# Patient Record
Sex: Male | Born: 1955 | Race: White | Hispanic: No | Marital: Married | Smoking: Never smoker
Health system: Southern US, Community
[De-identification: ages and names within clinical notes are randomized; demographics above are authoritative.]

## PROBLEM LIST (undated history)

## (undated) DIAGNOSIS — Z9889 Other specified postprocedural states: Secondary | ICD-10-CM

## (undated) DIAGNOSIS — Z96659 Presence of unspecified artificial knee joint: Secondary | ICD-10-CM

## (undated) DIAGNOSIS — T8859XA Other complications of anesthesia, initial encounter: Secondary | ICD-10-CM

## (undated) DIAGNOSIS — T4145XA Adverse effect of unspecified anesthetic, initial encounter: Secondary | ICD-10-CM

## (undated) DIAGNOSIS — E7800 Pure hypercholesterolemia, unspecified: Secondary | ICD-10-CM

## (undated) DIAGNOSIS — R112 Nausea with vomiting, unspecified: Secondary | ICD-10-CM

## (undated) DIAGNOSIS — M1990 Unspecified osteoarthritis, unspecified site: Secondary | ICD-10-CM

## (undated) HISTORY — PX: BACK SURGERY: SHX140

## (undated) HISTORY — PX: CARPAL TUNNEL RELEASE: SHX101

## (undated) HISTORY — PX: OTHER SURGICAL HISTORY: SHX169

---

## 2003-07-14 ENCOUNTER — Ambulatory Visit (HOSPITAL_BASED_OUTPATIENT_CLINIC_OR_DEPARTMENT_OTHER): Admission: RE | Admit: 2003-07-14 | Discharge: 2003-07-14 | Payer: Self-pay | Admitting: Orthopedic Surgery

## 2006-10-18 ENCOUNTER — Ambulatory Visit: Payer: Self-pay | Admitting: Orthopedic Surgery

## 2007-06-20 ENCOUNTER — Ambulatory Visit: Payer: Self-pay | Admitting: Orthopedic Surgery

## 2008-02-04 ENCOUNTER — Encounter: Payer: Self-pay | Admitting: Orthopedic Surgery

## 2008-03-03 ENCOUNTER — Ambulatory Visit: Payer: Self-pay | Admitting: Orthopedic Surgery

## 2008-03-03 DIAGNOSIS — M25579 Pain in unspecified ankle and joints of unspecified foot: Secondary | ICD-10-CM

## 2011-04-07 NOTE — Op Note (Signed)
NAME:  Corey Clarke, Corey Clarke NO.:  [id number]   MEDICAL RECORD NO.:  [id number]                   PATIENT TYPE:  AMB   LOCATION:  DSC                                  FACILITY:  [facility]   PHYSICIAN:  Cindee Salt, M.D.                    DATE OF BIRTH:  21-Sep-1956   DATE OF PROCEDURE:  07/14/2003  DATE OF DISCHARGE:                                 OPERATIVE REPORT   PREOPERATIVE DIAGNOSIS:  Carpal tunnel syndrome, left hand.   POSTOPERATIVE DIAGNOSIS:  Carpal tunnel syndrome, left hand.   OPERATION PERFORMED:  Decompression, left median nerve.   SURGEON:  Cindee Salt, M.D.   ASSISTANTCarolyne Fiscal.   ANESTHESIA:  General.   INDICATIONS FOR PROCEDURE:  The patient is a 55 year old male with a history  of carpal tunnel syndrome, EMG nerve conduction positive, which has not  responded to conservative treatment.   DESCRIPTION OF PROCEDURE:  The patient was brought to the operating room  with the intent of doing an IV regional anesthetic; however, he had some  changes to the circulation of his hand and Anesthesia felt better with a  general anesthetic rather than injection.  A LMA was placed.  General  anesthetic given.  He was prepped using DuraPrep in supine position.  Left  arm free.  The limb was exsanguinated with an Esmarch bandage.  Tourniquet  placed on the forearm, was inflated to .  A longitudinal incision was  made in the palm and carried down through subcutaneous tissue, bleeders were  electrocauterized.  The palmar fascia was split superficial palmar arch  identified.  The flexor tendon to the ring and little finger identified to  the ulnar side of the medial nerve.  The carpal retinaculum was incised with  sharp dissection.  A right angle and Sewell retractor were placed between  the skin and forearm fascia.  The fascia was released for approximately 3 cm  proximal to the wrist crease under direct vision.  It now was explored.  Tenosynovial  tissue was thickened.  The motor branch was noted to be tented  over retinaculum.  This portion of the retinaculum was incised.  No further  lesions were identified.  The wound was irrigated.  Skin was closed with  interrupted 5-0 nylon sutures.  Sterile compressive dressing and splint was  applied.  The patient tolerated the procedure well and was taken to the  recovery room for observation in satisfactory condition.  He is discharged  to home to return to the [facility] in one week on Vicodin  and Keflex.                                                Cindee Salt,  M.D.    Angelique Blonder  D:  07/14/2003  T:  07/14/2003  Job:  [id number]

## 2016-10-24 ENCOUNTER — Ambulatory Visit: Payer: Self-pay | Admitting: Orthopedic Surgery

## 2016-11-30 ENCOUNTER — Encounter (HOSPITAL_COMMUNITY): Admission: RE | Admit: 2016-11-30 | Payer: Self-pay | Source: Ambulatory Visit

## 2016-12-07 ENCOUNTER — Encounter (HOSPITAL_COMMUNITY): Payer: Self-pay

## 2016-12-07 ENCOUNTER — Inpatient Hospital Stay (HOSPITAL_COMMUNITY): Admit: 2016-12-07 | Payer: Self-pay | Admitting: Specialist

## 2016-12-07 SURGERY — ARTHROPLASTY, KNEE, TOTAL
Anesthesia: General | Laterality: Right

## 2017-04-30 ENCOUNTER — Ambulatory Visit: Payer: Self-pay | Admitting: Specialist

## 2017-06-06 NOTE — Patient Instructions (Signed)
Corey Clarke  06/06/2017   Your procedure is scheduled on: 06/21/17  Report to [facility] Main  Entrance Take [place]  elevators to 3rd floor to  [address] at     [phone].    Call this number if you have problems the morning of surgery [phone]     Remember: ONLY 1 PERSON MAY GO WITH YOU TO SHORT STAY TO GET  READY MORNING OF YOUR SURGERY.  Do not eat food or drink liquids :After Midnight.     Take these medicines the morning of surgery with A SIP OF WATER:  DO NOT TAKE ANY DIABETIC MEDICATIONS DAY OF YOUR SURGERY                               You may not have any metal on your body including hair pins and              piercings  Do not wear jewelry, make-up, lotions, powders or perfumes, deodorant             Do not wear nail polish.  Do not shave  48 hours prior to surgery.              Men may shave face and neck.   Do not bring valuables to the hospital. [place].  Contacts, dentures or bridgework may not be worn into surgery.  Leave suitcase in the car. After surgery it may be brought to your room.     Patients discharged the day of surgery will not be allowed to drive home.  Name and phone number of your driver:  Special Instructions: N/A              Please read over the following fact sheets you were given: _____________________________________________________________________             [facility] - Preparing for Surgery Before surgery, you can play an important role.  Because skin is not sterile, your skin needs to be as free of germs as possible.  You can reduce the number of germs on your skin by washing with CHG (chlorahexidine gluconate) soap before surgery.  CHG is an antiseptic cleaner which kills germs and bonds with the skin to continue killing germs even after washing. Please DO NOT use if you have an allergy to CHG or antibacterial soaps.  If your skin becomes  reddened/irritated stop using the CHG and inform your nurse when you arrive at Short Stay. Do not shave (including legs and underarms) for at least 48 hours prior to the first CHG shower.  You may shave your face/neck. Please follow these instructions carefully:  1.  Shower with CHG Soap the night before surgery and the  morning of Surgery.  2.  If you choose to wash your hair, wash your hair first as usual with your  normal  shampoo.  3.  After you shampoo, rinse your hair and body thoroughly to remove the  shampoo.                           4.  Use CHG as you would any other liquid soap.  You can apply chg directly  to the skin and wash                       Gently with a scrungie or clean washcloth.  5.  Apply the CHG Soap to your body ONLY FROM THE NECK DOWN.   Do not use on face/ open                           Wound or open sores. Avoid contact with eyes, ears mouth and genitals (private parts).                       Wash face,  Genitals (private parts) with your normal soap.             6.  Wash thoroughly, paying special attention to the area where your surgery  will be performed.  7.  Thoroughly rinse your body with warm water from the neck down.  8.  DO NOT shower/wash with your normal soap after using and rinsing off  the CHG Soap.                9.  Pat yourself dry with a clean towel.            10.  Wear clean pajamas.            11.  Place clean sheets on your bed the night of your first shower and do not  sleep with pets. Day of Surgery : Do not apply any lotions/deodorants the morning of surgery.  Please wear clean clothes to the hospital/surgery center.  FAILURE TO FOLLOW THESE INSTRUCTIONS MAY RESULT IN THE CANCELLATION OF YOUR SURGERY PATIENT SIGNATURE_________________________________  NURSE SIGNATURE__________________________________  ________________________________________________________________________  WHAT IS A BLOOD TRANSFUSION? Blood Transfusion Information  A  transfusion is the replacement of blood or some of its parts. Blood is made up of multiple cells which provide different functions.  Red blood cells carry oxygen and are used for blood loss replacement.  White blood cells fight against infection.  Platelets control bleeding.  Plasma helps clot blood.  Other blood products are available for specialized needs, such as hemophilia or other clotting disorders. BEFORE THE TRANSFUSION  Who gives blood for transfusions?   Healthy volunteers who are fully evaluated to make sure their blood is safe. This is blood bank blood. Transfusion therapy is the safest it has ever been in the practice of medicine. Before blood is taken from a donor, a complete history is taken to make sure that person has no history of diseases nor engages in risky social behavior (examples are intravenous drug use or sexual activity with multiple partners). The donor's travel history is screened to minimize risk of transmitting infections, such as malaria. The donated blood is tested for signs of infectious diseases, such as HIV and hepatitis. The blood is then tested to be sure it is compatible with you in order to minimize the chance of a transfusion reaction. If you or a relative donates blood, this is often done in anticipation of surgery and is not appropriate for emergency situations. It takes many days to process the donated blood. RISKS AND COMPLICATIONS Although transfusion therapy is very safe and saves many lives, the main dangers of transfusion include:   Getting an infectious disease.  Developing a transfusion reaction. This is an allergic reaction to something in the blood you were given. Every precaution is taken to prevent  this. The decision to have a blood transfusion has been considered carefully by your caregiver before blood is given. Blood is not given unless the benefits outweigh the risks. AFTER THE TRANSFUSION  Right after receiving a blood transfusion,  you will usually feel much better and more energetic. This is especially true if your red blood cells have gotten low (anemic). The transfusion raises the level of the red blood cells which carry oxygen, and this usually causes an energy increase.  The nurse administering the transfusion will monitor you carefully for complications. HOME CARE INSTRUCTIONS  No special instructions are needed after a transfusion. You may find your energy is better. Speak with your caregiver about any limitations on activity for underlying diseases you may have. SEEK MEDICAL CARE IF:   Your condition is not improving after your transfusion.  You develop redness or irritation at the intravenous (IV) site. SEEK IMMEDIATE MEDICAL CARE IF:  Any of the following symptoms occur over the next 12 hours:  Shaking chills.  You have a temperature by mouth above 102 F (38.9 C), not controlled by medicine.  Chest, back, or muscle pain.  People around you feel you are not acting correctly or are confused.  Shortness of breath or difficulty breathing.  Dizziness and fainting.  You get a rash or develop hives.  You have a decrease in urine output.  Your urine turns a dark color or changes to pink, red, or brown. Any of the following symptoms occur over the next 10 days:  You have a temperature by mouth above 102 F (38.9 C), not controlled by medicine.  Shortness of breath.  Weakness after normal activity.  The white part of the eye turns yellow (jaundice).  You have a decrease in the amount of urine or are urinating less often.  Your urine turns a dark color or changes to pink, red, or brown. Document Released: 11/03/2000 Document Revised: 01/29/2012 Document Reviewed: 06/22/2008 ExitCare Patient Information 2014 [place].  _______________________________________________________________________  Incentive Spirometer  An incentive spirometer is a tool that can help keep your lungs clear and  active. This tool measures how well you are filling your lungs with each breath. Taking long deep breaths may help reverse or decrease the chance of developing breathing (pulmonary) problems (especially infection) following:  A long period of time when you are unable to move or be active. BEFORE THE PROCEDURE   If the spirometer includes an indicator to show your best effort, your nurse or respiratory therapist will set it to a desired goal.  If possible, sit up straight or lean slightly forward. Try not to slouch.  Hold the incentive spirometer in an upright position. INSTRUCTIONS FOR USE  1. Sit on the edge of your bed if possible, or sit up as far as you can in bed or on a chair. 2. Hold the incentive spirometer in an upright position. 3. Breathe out normally. 4. Place the mouthpiece in your mouth and seal your lips tightly around it. 5. Breathe in slowly and as deeply as possible, raising the piston or the ball toward the top of the column. 6. Hold your breath for 3-5 seconds or for as long as possible. Allow the piston or ball to fall to the bottom of the column. 7. Remove the mouthpiece from your mouth and breathe out normally. 8. Rest for a few seconds and repeat Steps 1 through 7 at least 10 times every 1-2 hours when you are awake. Take your time and take  a few normal breaths between deep breaths. 9. The spirometer may include an indicator to show your best effort. Use the indicator as a goal to work toward during each repetition. 10. After each set of 10 deep breaths, practice coughing to be sure your lungs are clear. If you have an incision (the cut made at the time of surgery), support your incision when coughing by placing a pillow or rolled up towels firmly against it. Once you are able to get out of bed, walk around indoors and cough well. You may stop using the incentive spirometer when instructed by your caregiver.  RISKS AND COMPLICATIONS  Take your time so you do not get  dizzy or light-headed.  If you are in pain, you may need to take or ask for pain medication before doing incentive spirometry. It is harder to take a deep breath if you are having pain. AFTER USE  Rest and breathe slowly and easily.  It can be helpful to keep track of a log of your progress. Your caregiver can provide you with a simple table to help with this. If you are using the spirometer at home, follow these instructions: [place] IF:   You are having difficultly using the spirometer.  You have trouble using the spirometer as often as instructed.  Your pain medication is not giving enough relief while using the spirometer.  You develop fever of 100.5 F (38.1 C) or higher. SEEK IMMEDIATE MEDICAL CARE IF:   You cough up bloody sputum that had not been present before.  You develop fever of 102 F (38.9 C) or greater.  You develop worsening pain at or near the incision site. MAKE SURE YOU:   Understand these instructions.  Will watch your condition.  Will get help right away if you are not doing well or get worse. Document Released: 03/19/2007 Document Revised: 01/29/2012 Document Reviewed: 05/20/2007 [facility] Patient Information 2014 [place], [place].   ________________________________________________________________________

## 2017-06-07 ENCOUNTER — Encounter (HOSPITAL_COMMUNITY): Payer: Self-pay

## 2017-06-07 ENCOUNTER — Encounter (HOSPITAL_COMMUNITY)
Admission: RE | Admit: 2017-06-07 | Discharge: 2017-06-07 | Disposition: A | Discharge status: Home / Self Care | Payer: 59 | Source: Ambulatory Visit | Attending: Specialist | Admitting: Specialist

## 2017-06-07 DIAGNOSIS — Z01818 Encounter for other preprocedural examination: Secondary | ICD-10-CM | POA: Insufficient documentation

## 2017-06-07 HISTORY — DX: Nausea with vomiting, unspecified: R11.2

## 2017-06-07 HISTORY — DX: Unspecified osteoarthritis, unspecified site: M19.90

## 2017-06-07 HISTORY — DX: Adverse effect of unspecified anesthetic, initial encounter: T41.45XA

## 2017-06-07 HISTORY — DX: Other specified postprocedural states: Z98.890

## 2017-06-07 HISTORY — DX: Other complications of anesthesia, initial encounter: T88.59XA

## 2017-06-07 LAB — BASIC METABOLIC PANEL
Anion gap: 9 (ref 5–15)
BUN: 17 mg/dL (ref 6–20)
CHLORIDE: 103 mmol/L (ref 101–111)
CO2: 27 mmol/L (ref 22–32)
CREATININE: 0.78 mg/dL (ref 0.61–1.24)
Calcium: 9.1 mg/dL (ref 8.9–10.3)
GFR calc Af Amer: >60 mL/min (ref >60)
GFR calc non Af Amer: >60 mL/min (ref >60)
Glucose, Bld: 99 mg/dL (ref 65–99)
Potassium: 4.5 mmol/L (ref 3.5–5.1)
Sodium: 139 mmol/L (ref 135–145)

## 2017-06-07 LAB — CBC
HCT: 44.2 % (ref 39.0–52.0)
Hemoglobin: 15.4 g/dL (ref 13.0–17.0)
MCH: 31.8 pg (ref 26.0–34.0)
MCHC: 34.8 g/dL (ref 30.0–36.0)
MCV: 91.1 fL (ref 78.0–100.0)
PLATELETS: 246 10*3/uL (ref 150–400)
RBC: 4.85 MIL/uL (ref 4.22–5.81)
RDW: 13.1 % (ref 11.5–15.5)
WBC: 9.1 10*3/uL (ref 4.0–10.5)

## 2017-06-07 LAB — URINALYSIS, ROUTINE W REFLEX MICROSCOPIC
BACTERIA UA: NONE SEEN
Bilirubin Urine: NEGATIVE
Glucose, UA: NEGATIVE mg/dL
HGB URINE DIPSTICK: NEGATIVE
Ketones, ur: NEGATIVE mg/dL
Nitrite: NEGATIVE
Protein, ur: NEGATIVE mg/dL
SPECIFIC GRAVITY, URINE: 1.016 (ref 1.005–1.030)
SQUAMOUS EPITHELIAL / LPF: NONE SEEN
pH: 6 (ref 5.0–8.0)

## 2017-06-07 LAB — SURGICAL PCR SCREEN
MRSA, PCR: NEGATIVE
STAPHYLOCOCCUS AUREUS: NEGATIVE

## 2017-06-07 LAB — APTT: APTT: 26 s (ref 24–36)

## 2017-06-07 LAB — PROTIME-INR
INR: 0.96
Prothrombin Time: 12.8 seconds (ref 11.4–15.2)

## 2017-06-11 ENCOUNTER — Ambulatory Visit: Payer: Self-pay | Admitting: Orthopedic Surgery

## 2017-06-18 ENCOUNTER — Ambulatory Visit: Payer: Self-pay | Admitting: Orthopedic Surgery

## 2017-06-18 NOTE — H&P (Signed)
Tawanna Sat DOB: 09/26/1956 Married / Language: Cleophus Molt / Race: White Male  H&P date: 06/18/17  Chief Complaint: Right knee pain  History of Present Illness The patient is a 61 year old male who comes in today for a preoperative History and Physical. The patient is scheduled for a right total knee arthroplasty to be performed by Dr. Johnn Hai, MD at [facility] on June 21, 2017 .   Yvone Neu reports chronic pain right knee, progressively worsening over time, refractory to conservative treatment including injection therapy, bracing, activity modifications, quad strengthening, medication, relative rest. Pain is interfering with his activities of daily living and quality of life at this point and he desires to proceed with surgery. His wife is here with him for H&P today. He did already have his [place] long preop appointment.  Dr. Tonita Cong and the patient mutually agreed to proceed with a right total knee replacement. Risks and benefits of the procedure were discussed including stiffness, suboptimal range of motion, persistent pain, infection requiring removal of prosthesis and reinsertion, need for prophylactic antibiotics in the future, for example, dental procedures, possible need for manipulation, revision in the future and also anesthetic complications including DVT, PE, etc. We discussed the perioperative course, time in the hospital, postoperative recovery and the need for elevation to control swelling. We also discussed the predicted range of motion and the probability that squatting and kneeling would be unobtainable in the future. In addition, postoperative anticoagulation was discussed. We have obtained preoperative medical clearance as necessary. Provided illustrated handout and discussed it in detail. They will enroll in the total joint replacement educational forum at the hospital.  Problem List/Past Medical Hx Primary osteoarthritis of left knee (M17.12)  Primary  osteoarthritis of right hip (M16.11)  Bilateral knee pain (M25.561, M25.562)  Hypercholesterolemia   Allergies No Known Drug Allergies [02/02/2014]:  Family History Heart Disease  Father, Paternal Grandfather. Depression  Paternal Grandmother. Cerebrovascular Accident  Father. First Degree Relatives   Social History Tobacco use  Never smoker. 02/02/2014 Marital status  married No history of drug/alcohol rehab  Exercise  Exercises monthly; does running / walking Living situation  live with spouse Not under pain contract  Number of flights of stairs before winded  4-5 Current work status  working full time Children  1 Current drinker  02/02/2014: Currently drinks beer 5-7 times per week  Medication History Diclofenac Sodium (75MG  Tablet DR, Oral) Active. Escitalopram Oxalate (20MG  Tablet, Oral) Active. TiZANidine HCl (4MG  Tablet, Oral) Active. Atorvastatin Calcium (10MG  Tablet, Oral) Active. Norco (5-325MG  Tablet, Oral) Active. Medications Reconciled  Past Surgical History Carpal Tunnel Repair  left Spinal fusion 11/2016 Dr. Carloyn Manner Left elbow (staph infx)  Vitals 06/18/2017 8:58 AM Weight: 201 lb Height: 68.5in Body Surface Area: 2.06 m Body Mass Index: 30.12 kg/m  BP: 162/91 (Sitting, Right Arm, Standard)  Physical Exam General Mental Status -Alert, cooperative and good historian. General Appearance-pleasant, Not in acute distress. Orientation-Oriented X3. Build & Nutrition-Well nourished and Well developed. Gait-Antalgic. Note: Varus thrust right knee  Head and Neck Head-normocephalic, atraumatic . Neck Global Assessment - supple, no bruit auscultated on the right, no bruit auscultated on the left.  Eye Pupil - Bilateral-Regular and Round. Motion - Bilateral-EOMI.  Chest and Lung Exam Auscultation Breath sounds - clear at anterior chest wall and clear at posterior chest wall. Adventitious sounds - No  Adventitious sounds.  Cardiovascular Auscultation Rhythm - Regular rate and rhythm. Heart Sounds - S1 WNL and S2 WNL. Murmurs & Other  Heart Sounds - Auscultation of the heart reveals - No Murmurs.  Abdomen Palpation/Percussion Tenderness - Abdomen is non-tender to palpation. Rigidity (guarding) - Abdomen is soft. Auscultation Auscultation of the abdomen reveals - Bowel sounds normal.  Male Genitourinary Not done, not pertinent to present illness  Musculoskeletal On exam, he has got a varus deformity. He is exquisitely tender in medial joint line. Patellofemoral pain with compression. Trace effusion. Range is 0 to 140.  Imaging AP standing and lateral demonstrate bone on bone arthrosis of medial compartment bilaterally and patellofemoral arthrosis as well.  Assessment & Plan Primary osteoarthritis of right knee (M17.11)  Pt with end-stage right knee DJD, bone-on-bone, refractory to conservative tx, scheduled for right total knee replacement by Dr. Tonita Cong on August 2nd. We again discussed the procedure itself as well as risks, complications and alternatives, including but not limited to DVT, PE, infx, bleeding, failure of procedure, need for secondary procedure including manipulation, nerve injury, ongoing pain/symptoms, anesthesia risk, even stroke or death. Also discussed typical post-op protocols, activity restrictions, need for PT, flexion/extension exercises, time out of work. Discussed need for DVT ppx post-op per protocol. Discussed dental ppx and infx prevention. Also discussed limitations post-operatively such as kneeling and squatting. All questions were answered. Patient desires to proceed with surgery as scheduled.  Will hold supplements, ASA and NSAIDs accordingly. Will remain NPO after midnight the night before surgery. Will present to [facility] for pre-op testing. Anticipate hospital stay to include at least 2 midnights given medical history and to ensure proper pain control. Plan ASA  325mg  BID for DVT ppx post-op. Plan Percocet, Robaxin, Colace, Miralax. Plan home with HHPT post-op with family members at home for assistance. Will follow up 10-14 days post-op for staple removal and xrays.  Plan right total knee replacement  Signed electronically by Cecilie Kicks, PA-C for Dr. Tonita Cong

## 2017-06-20 ENCOUNTER — Ambulatory Visit: Payer: Self-pay | Admitting: Orthopedic Surgery

## 2017-06-21 ENCOUNTER — Encounter (HOSPITAL_COMMUNITY): Payer: Self-pay | Admitting: *Deleted

## 2017-06-21 ENCOUNTER — Inpatient Hospital Stay (HOSPITAL_COMMUNITY): Payer: 59

## 2017-06-21 ENCOUNTER — Inpatient Hospital Stay (HOSPITAL_COMMUNITY): Payer: 59 | Admitting: Certified Registered Nurse Anesthetist

## 2017-06-21 ENCOUNTER — Inpatient Hospital Stay (HOSPITAL_COMMUNITY)
Admission: RE | Admit: 2017-06-21 | Discharge: 2017-06-23 | DRG: 470 | Disposition: A | Discharge status: Home Health | Payer: 59 | Source: Ambulatory Visit | Attending: Specialist | Admitting: Specialist

## 2017-06-21 ENCOUNTER — Encounter (HOSPITAL_COMMUNITY)
Admission: RE | Disposition: A | Discharge status: Home Health | Payer: Self-pay | Source: Ambulatory Visit | Attending: Specialist

## 2017-06-21 DIAGNOSIS — M1711 Unilateral primary osteoarthritis, right knee: Principal | ICD-10-CM | POA: Diagnosis present

## 2017-06-21 DIAGNOSIS — E7800 Pure hypercholesterolemia, unspecified: Secondary | ICD-10-CM | POA: Diagnosis present

## 2017-06-21 DIAGNOSIS — Z79899 Other long term (current) drug therapy: Secondary | ICD-10-CM | POA: Diagnosis not present

## 2017-06-21 DIAGNOSIS — M25561 Pain in right knee: Secondary | ICD-10-CM | POA: Diagnosis present

## 2017-06-21 DIAGNOSIS — Z981 Arthrodesis status: Secondary | ICD-10-CM | POA: Diagnosis not present

## 2017-06-21 DIAGNOSIS — M21161 Varus deformity, not elsewhere classified, right knee: Secondary | ICD-10-CM | POA: Diagnosis present

## 2017-06-21 DIAGNOSIS — Z96659 Presence of unspecified artificial knee joint: Secondary | ICD-10-CM

## 2017-06-21 HISTORY — PX: TOTAL KNEE ARTHROPLASTY: SHX125

## 2017-06-21 LAB — ABO/RH: ABO/RH(D): A POS

## 2017-06-21 LAB — TYPE AND SCREEN
ABO/RH(D): A POS
ANTIBODY SCREEN: NEGATIVE

## 2017-06-21 SURGERY — ARTHROPLASTY, KNEE, TOTAL
Anesthesia: Spinal | Site: Knee | Laterality: Right

## 2017-06-21 MED ORDER — MENTHOL 3 MG MT LOZG: 1.0000 | LOZENGE | OROMUCOSAL | Status: DC | PRN

## 2017-06-21 MED ORDER — ONDANSETRON HCL 4 MG PO TABS: 4.0000 mg | ORAL_TABLET | Freq: Four times a day (QID) | ORAL | Status: DC | PRN

## 2017-06-21 MED ORDER — LIDOCAINE 2% (20 MG/ML) 5 ML SYRINGE
INTRAMUSCULAR | Status: AC
  Filled 2017-06-21: qty 5

## 2017-06-21 MED ORDER — PHENYLEPHRINE HCL 10 MG/ML IJ SOLN
INTRAMUSCULAR | Status: DC | PRN
  Administered 2017-06-21: 40 ug via INTRAVENOUS
  Administered 2017-06-21: 80 ug via INTRAVENOUS
  Administered 2017-06-21: 40 ug via INTRAVENOUS

## 2017-06-21 MED ORDER — METOCLOPRAMIDE HCL 5 MG/ML IJ SOLN: 5.0000 mg | Freq: Three times a day (TID) | INTRAMUSCULAR | Status: DC | PRN

## 2017-06-21 MED ORDER — MIDAZOLAM HCL 2 MG/2ML IJ SOLN
INTRAMUSCULAR | Status: AC
  Filled 2017-06-21: qty 2

## 2017-06-21 MED ORDER — LIDOCAINE HCL (CARDIAC) 20 MG/ML IV SOLN
INTRAVENOUS | Status: DC | PRN
  Administered 2017-06-21: 50 mg via INTRAVENOUS

## 2017-06-21 MED ORDER — ACETAMINOPHEN 10 MG/ML IV SOLN
1000.0000 mg | Freq: Four times a day (QID) | INTRAVENOUS | Status: AC
  Administered 2017-06-21 – 2017-06-22 (×4): 1000 mg via INTRAVENOUS
  Filled 2017-06-21 (×4): qty 100

## 2017-06-21 MED ORDER — DOCUSATE SODIUM 100 MG PO CAPS: 100.0000 mg | ORAL_CAPSULE | Freq: Two times a day (BID) | ORAL | 1 refills | Status: DC | PRN

## 2017-06-21 MED ORDER — KCL IN DEXTROSE-NACL 20-5-0.45 MEQ/L-%-% IV SOLN
INTRAVENOUS | Status: AC
  Administered 2017-06-21 (×2): via INTRAVENOUS
  Filled 2017-06-21 (×2): qty 1000

## 2017-06-21 MED ORDER — PROPOFOL 10 MG/ML IV BOLUS
INTRAVENOUS | Status: AC
  Filled 2017-06-21: qty 20

## 2017-06-21 MED ORDER — MIDAZOLAM HCL 5 MG/5ML IJ SOLN
INTRAMUSCULAR | Status: DC | PRN
  Administered 2017-06-21: 2 mg via INTRAVENOUS

## 2017-06-21 MED ORDER — MAGNESIUM CITRATE PO SOLN: 1.0000 | Freq: Once | ORAL | Status: DC | PRN

## 2017-06-21 MED ORDER — CEFAZOLIN SODIUM-DEXTROSE 2-4 GM/100ML-% IV SOLN
2.0000 g | INTRAVENOUS | Status: AC
  Administered 2017-06-21: 2 g via INTRAVENOUS

## 2017-06-21 MED ORDER — ACETAMINOPHEN 325 MG PO TABS
650.0000 mg | ORAL_TABLET | Freq: Four times a day (QID) | ORAL | Status: DC | PRN
  Administered 2017-06-22 – 2017-06-23 (×2): 650 mg via ORAL
  Filled 2017-06-21 (×2): qty 2

## 2017-06-21 MED ORDER — EPHEDRINE 5 MG/ML INJ
INTRAVENOUS | Status: AC
  Filled 2017-06-21: qty 10

## 2017-06-21 MED ORDER — LACTATED RINGERS IV SOLN
INTRAVENOUS | Status: DC
  Administered 2017-06-21 (×2): via INTRAVENOUS

## 2017-06-21 MED ORDER — DEXAMETHASONE SODIUM PHOSPHATE 10 MG/ML IJ SOLN
INTRAMUSCULAR | Status: AC
  Filled 2017-06-21: qty 1

## 2017-06-21 MED ORDER — ACETAMINOPHEN 10 MG/ML IV SOLN
INTRAVENOUS | Status: AC
  Filled 2017-06-21: qty 100

## 2017-06-21 MED ORDER — PROMETHAZINE HCL 25 MG/ML IJ SOLN: 6.2500 mg | INTRAMUSCULAR | Status: DC | PRN

## 2017-06-21 MED ORDER — ALUM & MAG HYDROXIDE-SIMETH 200-200-20 MG/5ML PO SUSP: 30.0000 mL | ORAL | Status: DC | PRN

## 2017-06-21 MED ORDER — METHOCARBAMOL 500 MG PO TABS: 500.0000 mg | ORAL_TABLET | Freq: Four times a day (QID) | ORAL | 1 refills | Status: DC | PRN

## 2017-06-21 MED ORDER — EPHEDRINE SULFATE 50 MG/ML IJ SOLN
INTRAMUSCULAR | Status: DC | PRN
  Administered 2017-06-21 (×3): 5 mg via INTRAVENOUS

## 2017-06-21 MED ORDER — CEFAZOLIN SODIUM-DEXTROSE 2-4 GM/100ML-% IV SOLN
2.0000 g | Freq: Four times a day (QID) | INTRAVENOUS | Status: AC
  Administered 2017-06-21 – 2017-06-22 (×3): 2 g via INTRAVENOUS
  Filled 2017-06-21 (×3): qty 100

## 2017-06-21 MED ORDER — POLYETHYLENE GLYCOL 3350 17 G PO PACK: 17.0000 g | PACK | Freq: Every day | ORAL | Status: DC | PRN

## 2017-06-21 MED ORDER — FENTANYL CITRATE (PF) 100 MCG/2ML IJ SOLN: 25.0000 ug | INTRAMUSCULAR | Status: DC | PRN

## 2017-06-21 MED ORDER — BUPIVACAINE-EPINEPHRINE 0.25% -1:200000 IJ SOLN
INTRAMUSCULAR | Status: AC
  Filled 2017-06-21: qty 1

## 2017-06-21 MED ORDER — BUPIVACAINE IN DEXTROSE 0.75-8.25 % IT SOLN
INTRATHECAL | Status: DC | PRN
  Administered 2017-06-21: 2 mL via INTRATHECAL

## 2017-06-21 MED ORDER — BISACODYL 5 MG PO TBEC: 5.0000 mg | DELAYED_RELEASE_TABLET | Freq: Every day | ORAL | Status: DC | PRN

## 2017-06-21 MED ORDER — GABAPENTIN 300 MG PO CAPS
300.0000 mg | ORAL_CAPSULE | Freq: Once | ORAL | Status: AC
  Administered 2017-06-21: 300 mg via ORAL

## 2017-06-21 MED ORDER — DOCUSATE SODIUM 100 MG PO CAPS
100.0000 mg | ORAL_CAPSULE | Freq: Two times a day (BID) | ORAL | Status: DC
  Administered 2017-06-21 – 2017-06-23 (×4): 100 mg via ORAL
  Filled 2017-06-21 (×4): qty 1

## 2017-06-21 MED ORDER — HYDROMORPHONE HCL-NACL 0.5-0.9 MG/ML-% IV SOSY
1.0000 mg | PREFILLED_SYRINGE | INTRAVENOUS | Status: DC | PRN
  Administered 2017-06-21 – 2017-06-22 (×7): 1 mg via INTRAVENOUS
  Filled 2017-06-21 (×7): qty 2

## 2017-06-21 MED ORDER — PROPOFOL 500 MG/50ML IV EMUL
INTRAVENOUS | Status: DC | PRN
  Administered 2017-06-21: 125 ug/kg/min via INTRAVENOUS

## 2017-06-21 MED ORDER — DEXAMETHASONE SODIUM PHOSPHATE 4 MG/ML IJ SOLN
INTRAMUSCULAR | Status: DC | PRN
  Administered 2017-06-21: 10 mg via INTRAVENOUS

## 2017-06-21 MED ORDER — PSEUDOEPHEDRINE HCL 30 MG PO TABS
30.0000 mg | ORAL_TABLET | Freq: Two times a day (BID) | ORAL | Status: DC | PRN
  Filled 2017-06-21: qty 1

## 2017-06-21 MED ORDER — METHOCARBAMOL 1000 MG/10ML IJ SOLN
500.0000 mg | Freq: Four times a day (QID) | INTRAMUSCULAR | Status: DC | PRN
  Administered 2017-06-21: 500 mg via INTRAVENOUS
  Filled 2017-06-21: qty 5

## 2017-06-21 MED ORDER — FENTANYL CITRATE (PF) 100 MCG/2ML IJ SOLN
INTRAMUSCULAR | Status: DC | PRN
  Administered 2017-06-21: 100 ug via INTRAVENOUS

## 2017-06-21 MED ORDER — OXYCODONE HCL 5 MG PO TABS: 5.0000 mg | ORAL_TABLET | ORAL | 0 refills | Status: DC | PRN

## 2017-06-21 MED ORDER — ASPIRIN EC 325 MG PO TBEC: 325.0000 mg | DELAYED_RELEASE_TABLET | Freq: Two times a day (BID) | ORAL | 1 refills | Status: DC

## 2017-06-21 MED ORDER — LORATADINE 10 MG PO TABS
10.0000 mg | ORAL_TABLET | Freq: Every day | ORAL | Status: DC
  Administered 2017-06-21 – 2017-06-23 (×3): 10 mg via ORAL
  Filled 2017-06-21 (×3): qty 1

## 2017-06-21 MED ORDER — BUPIVACAINE-EPINEPHRINE 0.25% -1:200000 IJ SOLN
INTRAMUSCULAR | Status: DC | PRN
  Administered 2017-06-21: 20 mL
  Administered 2017-06-21: 30 mL

## 2017-06-21 MED ORDER — PHENYLEPHRINE 40 MCG/ML (10ML) SYRINGE FOR IV PUSH (FOR BLOOD PRESSURE SUPPORT)
PREFILLED_SYRINGE | INTRAVENOUS | Status: AC
  Filled 2017-06-21: qty 10

## 2017-06-21 MED ORDER — DIPHENHYDRAMINE HCL 12.5 MG/5ML PO ELIX
12.5000 mg | ORAL_SOLUTION | ORAL | Status: DC | PRN
  Administered 2017-06-22 (×2): 25 mg via ORAL
  Filled 2017-06-21 (×2): qty 10

## 2017-06-21 MED ORDER — CELECOXIB 200 MG PO CAPS
ORAL_CAPSULE | ORAL | Status: AC
  Filled 2017-06-21: qty 1

## 2017-06-21 MED ORDER — ONDANSETRON HCL 4 MG/2ML IJ SOLN: 4.0000 mg | Freq: Four times a day (QID) | INTRAMUSCULAR | Status: DC | PRN

## 2017-06-21 MED ORDER — PHENOL 1.4 % MT LIQD: 1.0000 | OROMUCOSAL | Status: DC | PRN

## 2017-06-21 MED ORDER — ASPIRIN EC 325 MG PO TBEC
325.0000 mg | DELAYED_RELEASE_TABLET | Freq: Two times a day (BID) | ORAL | Status: DC
  Administered 2017-06-21 – 2017-06-23 (×4): 325 mg via ORAL
  Filled 2017-06-21 (×4): qty 1

## 2017-06-21 MED ORDER — CELECOXIB 200 MG PO CAPS
200.0000 mg | ORAL_CAPSULE | Freq: Once | ORAL | Status: AC
  Administered 2017-06-21: 200 mg via ORAL

## 2017-06-21 MED ORDER — ESCITALOPRAM OXALATE 10 MG PO TABS
10.0000 mg | ORAL_TABLET | Freq: Every day | ORAL | Status: DC
  Administered 2017-06-21 – 2017-06-22 (×2): 10 mg via ORAL
  Filled 2017-06-21 (×2): qty 1

## 2017-06-21 MED ORDER — POLYMYXIN B SULFATE 500000 UNITS IJ SOLR
INTRAMUSCULAR | Status: DC | PRN
  Administered 2017-06-21: 500 mL

## 2017-06-21 MED ORDER — RISAQUAD PO CAPS
1.0000 | ORAL_CAPSULE | Freq: Every day | ORAL | Status: DC
  Administered 2017-06-21 – 2017-06-23 (×3): 1 via ORAL
  Filled 2017-06-21 (×3): qty 1

## 2017-06-21 MED ORDER — ONDANSETRON HCL 4 MG/2ML IJ SOLN
INTRAMUSCULAR | Status: DC | PRN
  Administered 2017-06-21: 4 mg via INTRAVENOUS

## 2017-06-21 MED ORDER — OXYCODONE HCL 5 MG PO TABS
5.0000 mg | ORAL_TABLET | ORAL | Status: DC | PRN
  Administered 2017-06-21: 5 mg via ORAL
  Administered 2017-06-21 – 2017-06-23 (×15): 10 mg via ORAL
  Filled 2017-06-21 (×5): qty 2
  Filled 2017-06-21: qty 1
  Filled 2017-06-21 (×10): qty 2

## 2017-06-21 MED ORDER — BUPIVACAINE-EPINEPHRINE (PF) 0.5% -1:200000 IJ SOLN
INTRAMUSCULAR | Status: DC | PRN
  Administered 2017-06-21: 30 mL via PERINEURAL

## 2017-06-21 MED ORDER — METHOCARBAMOL 500 MG PO TABS
500.0000 mg | ORAL_TABLET | Freq: Four times a day (QID) | ORAL | Status: DC | PRN
  Administered 2017-06-21 – 2017-06-23 (×6): 500 mg via ORAL
  Filled 2017-06-21 (×6): qty 1

## 2017-06-21 MED ORDER — CHLORHEXIDINE GLUCONATE 4 % EX LIQD: 60.0000 mL | Freq: Once | CUTANEOUS | Status: DC

## 2017-06-21 MED ORDER — FENTANYL CITRATE (PF) 100 MCG/2ML IJ SOLN
INTRAMUSCULAR | Status: AC
  Filled 2017-06-21: qty 2

## 2017-06-21 MED ORDER — METOCLOPRAMIDE HCL 5 MG PO TABS: 5.0000 mg | ORAL_TABLET | Freq: Three times a day (TID) | ORAL | Status: DC | PRN

## 2017-06-21 MED ORDER — ONDANSETRON HCL 4 MG/2ML IJ SOLN
INTRAMUSCULAR | Status: AC
  Filled 2017-06-21: qty 2

## 2017-06-21 MED ORDER — CEFAZOLIN SODIUM-DEXTROSE 2-4 GM/100ML-% IV SOLN
INTRAVENOUS | Status: AC
  Filled 2017-06-21: qty 100

## 2017-06-21 MED ORDER — POLYETHYLENE GLYCOL 3350 17 G PO PACK: 17.0000 g | PACK | Freq: Every day | ORAL | 0 refills | Status: DC

## 2017-06-21 MED ORDER — SODIUM CHLORIDE 0.9 % IR SOLN
Status: DC | PRN
  Administered 2017-06-21 (×2): 1000 mL

## 2017-06-21 MED ORDER — TRIAMCINOLONE ACETONIDE 55 MCG/ACT NA AERO
1.0000 | INHALATION_SPRAY | Freq: Every day | NASAL | Status: DC
  Administered 2017-06-22: 10:00:00 1 via NASAL
  Filled 2017-06-21: qty 10.8

## 2017-06-21 MED ORDER — GABAPENTIN 300 MG PO CAPS
ORAL_CAPSULE | ORAL | Status: AC
  Filled 2017-06-21: qty 1

## 2017-06-21 MED ORDER — TRANEXAMIC ACID 1000 MG/10ML IV SOLN
1000.0000 mg | INTRAVENOUS | Status: AC
  Administered 2017-06-21: 1000 mg via INTRAVENOUS
  Filled 2017-06-21: qty 1100

## 2017-06-21 MED ORDER — SODIUM CHLORIDE 0.9 % IV SOLN
INTRAVENOUS | Status: AC
  Filled 2017-06-21: qty 500000

## 2017-06-21 MED ORDER — ACETAMINOPHEN 650 MG RE SUPP: 650.0000 mg | Freq: Four times a day (QID) | RECTAL | Status: DC | PRN

## 2017-06-21 MED ORDER — ACETAMINOPHEN 10 MG/ML IV SOLN
1000.0000 mg | Freq: Once | INTRAVENOUS | Status: AC
  Administered 2017-06-21: 1000 mg via INTRAVENOUS

## 2017-06-21 SURGICAL SUPPLY — 62 items
AGENT HMST SPONGE THK3/8 (HEMOSTASIS)
BAG SPEC THK2 15X12 ZIP CLS (MISCELLANEOUS)
BAG ZIPLOCK 12X15 (MISCELLANEOUS) IMPLANT
BANDAGE ACE 4X5 VEL STRL LF (GAUZE/BANDAGES/DRESSINGS) ×3 IMPLANT
BANDAGE ACE 6X5 VEL STRL LF (GAUZE/BANDAGES/DRESSINGS) ×3 IMPLANT
BLADE SAG 18X100X1.27 (BLADE) ×3 IMPLANT
BLADE SAW SGTL 11.0X1.19X90.0M (BLADE) ×3 IMPLANT
BLADE SAW SGTL 13.0X1.19X90.0M (BLADE) ×3 IMPLANT
CAPT KNEE TOTAL 3 ATTUNE ×3 IMPLANT
CEMENT HV SMART SET (Cement) ×6 IMPLANT
CLOSURE WOUND 1/2 X4 (GAUZE/BANDAGES/DRESSINGS)
CLOTH 2% CHLOROHEXIDINE 3PK (PERSONAL CARE ITEMS) ×3 IMPLANT
COVER SURGICAL LIGHT HANDLE (MISCELLANEOUS) ×3 IMPLANT
CUFF TOURN SGL QUICK 34 (TOURNIQUET CUFF) ×3
CUFF TRNQT CYL 34X4X40X1 (TOURNIQUET CUFF) ×1 IMPLANT
DECANTER SPIKE VIAL GLASS SM (MISCELLANEOUS) ×3 IMPLANT
DRAPE INCISE IOBAN 66X45 STRL (DRAPES) IMPLANT
DRAPE ORTHO SPLIT 77X108 STRL (DRAPES) ×6
DRAPE SHEET LG 3/4 BI-LAMINATE (DRAPES) ×3 IMPLANT
DRAPE SURG ORHT 6 SPLT 77X108 (DRAPES) ×2 IMPLANT
DRAPE U-SHAPE 47X51 STRL (DRAPES) ×3 IMPLANT
DRSG AQUACEL AG ADV 3.5X10 (GAUZE/BANDAGES/DRESSINGS) ×3 IMPLANT
DRSG TEGADERM 4X4.75 (GAUZE/BANDAGES/DRESSINGS) IMPLANT
DURAPREP 26ML APPLICATOR (WOUND CARE) ×3 IMPLANT
ELECT REM PT RETURN 15FT ADLT (MISCELLANEOUS) ×3 IMPLANT
EVACUATOR 1/8 PVC DRAIN (DRAIN) IMPLANT
GAUZE SPONGE 2X2 8PLY STRL LF (GAUZE/BANDAGES/DRESSINGS) IMPLANT
GLOVE BIOGEL PI IND STRL 7.0 (GLOVE) ×1 IMPLANT
GLOVE BIOGEL PI IND STRL 8 (GLOVE) ×1 IMPLANT
GLOVE BIOGEL PI INDICATOR 7.0 (GLOVE) ×2
GLOVE BIOGEL PI INDICATOR 8 (GLOVE) ×2
GLOVE SURG SS PI 7.0 STRL IVOR (GLOVE) ×3 IMPLANT
GLOVE SURG SS PI 7.5 STRL IVOR (GLOVE) ×15 IMPLANT
GLOVE SURG SS PI 8.0 STRL IVOR (GLOVE) ×6 IMPLANT
GOWN STRL REUS W/TWL XL LVL3 (GOWN DISPOSABLE) ×12 IMPLANT
HANDPIECE INTERPULSE COAX TIP (DISPOSABLE) ×3
HEMOSTAT SPONGE AVITENE ULTRA (HEMOSTASIS) IMPLANT
IMMOBILIZER KNEE 20 (SOFTGOODS) ×3
IMMOBILIZER KNEE 20 THIGH 36 (SOFTGOODS) ×1 IMPLANT
MANIFOLD NEPTUNE II (INSTRUMENTS) ×3 IMPLANT
NS IRRIG 1000ML POUR BTL (IV SOLUTION) IMPLANT
PACK TOTAL KNEE CUSTOM (KITS) ×3 IMPLANT
POSITIONER SURGICAL ARM (MISCELLANEOUS) ×3 IMPLANT
SET HNDPC FAN SPRY TIP SCT (DISPOSABLE) ×1 IMPLANT
SPONGE GAUZE 2X2 STER 10/PKG (GAUZE/BANDAGES/DRESSINGS)
SPONGE SURGIFOAM ABS GEL 100 (HEMOSTASIS) IMPLANT
STAPLER VISISTAT (STAPLE) ×3 IMPLANT
STRIP CLOSURE SKIN 1/2X4 (GAUZE/BANDAGES/DRESSINGS) IMPLANT
SUT BONE WAX W31G (SUTURE) IMPLANT
SUT MNCRL AB 4-0 PS2 18 (SUTURE) IMPLANT
SUT STRATAFIX 0 PDS 27 VIOLET (SUTURE) ×3
SUT VIC AB 1 CT1 27 (SUTURE) ×6
SUT VIC AB 1 CT1 27XBRD ANTBC (SUTURE) ×2 IMPLANT
SUT VIC AB 2-0 CT1 27 (SUTURE) ×6
SUT VIC AB 2-0 CT1 TAPERPNT 27 (SUTURE) ×3 IMPLANT
SUTURE STRATFX 0 PDS 27 VIOLET (SUTURE) ×1 IMPLANT
SYR 50ML LL SCALE MARK (SYRINGE) IMPLANT
TOWER CARTRIDGE SMART MIX (DISPOSABLE) ×3 IMPLANT
TRAY FOLEY W/METER SILVER 16FR (SET/KITS/TRAYS/PACK) ×3 IMPLANT
WATER STERILE IRR 1500ML POUR (IV SOLUTION) ×3 IMPLANT
WRAP KNEE MAXI GEL POST OP (GAUZE/BANDAGES/DRESSINGS) ×3 IMPLANT
YANKAUER SUCT BULB TIP 10FT TU (MISCELLANEOUS) ×3 IMPLANT

## 2017-06-21 NOTE — Anesthesia Preprocedure Evaluation (Signed)
Anesthesia Evaluation  Patient identified by MRN, date of birth, ID band Patient awake    Reviewed: Allergy & Precautions, NPO status , Patient's Chart, lab work & pertinent test results  History of Anesthesia Complications (+) PONV and history of anesthetic complications  Airway Mallampati: II  TM Distance: >3 FB Neck ROM: Full    Dental  (+) Teeth Intact, Dental Advisory Given   Pulmonary neg pulmonary ROS,    Pulmonary exam normal breath sounds clear to auscultation       Cardiovascular Exercise Tolerance: Good negative cardio ROS Normal cardiovascular exam Rhythm:Regular Rate:Normal     Neuro/Psych negative neurological ROS     GI/Hepatic negative GI ROS, Neg liver ROS,   Endo/Other  negative endocrine ROS  Renal/GU negative Renal ROS     Musculoskeletal  (+) Arthritis , Osteoarthritis,    Abdominal   Peds  Hematology negative hematology ROS (+) Plt 246k   Anesthesia Other Findings Day of surgery medications reviewed with the patient.  Reproductive/Obstetrics                             Anesthesia Physical Anesthesia Plan  ASA: I  Anesthesia Plan: Spinal   Post-op Pain Management:  Regional for Post-op pain   Induction:   PONV Risk Score and Plan: 2 and Ondansetron, Dexamethasone and Propofol infusion  Airway Management Planned: Simple Face Mask  Additional Equipment:   Intra-op Plan:   Post-operative Plan:   Informed Consent: I have reviewed the patients History and Physical, chart, labs and discussed the procedure including the risks, benefits and alternatives for the proposed anesthesia with the patient or authorized representative who has indicated his/her understanding and acceptance.   Dental advisory given  Plan Discussed with: CRNA, Anesthesiologist and Surgeon  Anesthesia Plan Comments: (Adductor canal nerve block plus spinal.)        Anesthesia  Quick Evaluation

## 2017-06-21 NOTE — Transfer of Care (Signed)
Immediate Anesthesia Transfer of Care Note  Patient: Corey Clarke  Procedure(s) Performed: Procedure(s) with comments: RIGHT TOTAL KNEE ARTHROPLASTY (Right) - 120 mins  Patient Location: PACU  Anesthesia Type:Spinal  Level of Consciousness:  sedated, patient cooperative and responds to stimulation  Airway & Oxygen Therapy:Patient Spontanous Breathing and Patient connected to face mask oxgen  Post-op Assessment:  Report given to PACU RN and Post -op Vital signs reviewed and stable  Post vital signs:  Reviewed and stable  Last Vitals:  Vitals:   06/21/17 0526  BP: (!) 159/87  Pulse: 85  Resp: 18  Temp: 08.1 C    Complications: No apparent anesthesia complications

## 2017-06-21 NOTE — Progress Notes (Signed)
CSW consulted for SNF placement. H&P notes that pt will return home with [facility] services at dc. CSW will be available to assist with SNF placement if plan changes an placement is needed.  Werner Lean LCSW [phone]

## 2017-06-21 NOTE — Discharge Instructions (Signed)

## 2017-06-21 NOTE — Anesthesia Postprocedure Evaluation (Signed)
Anesthesia Post Note  Patient: Corey Clarke  Procedure(s) Performed: Procedure(s) (LRB): RIGHT TOTAL KNEE ARTHROPLASTY (Right)     Patient location during evaluation: PACU Anesthesia Type: Spinal Level of consciousness: oriented and awake and alert Pain management: pain level controlled Vital Signs Assessment: post-procedure vital signs reviewed and stable Respiratory status: spontaneous breathing and respiratory function stable Cardiovascular status: blood pressure returned to baseline and stable Postop Assessment: no headache, no backache, spinal receding, no signs of nausea or vomiting and patient able to bend at knees Anesthetic complications: no    Last Vitals:  Vitals:   06/21/17 1240 06/21/17 1345  BP: 121/68 (!) 141/83  Pulse: 78 81  Resp: 16 16  Temp:  36.5 C    Last Pain:  Vitals:   06/21/17 1404  TempSrc:   PainSc: [place]

## 2017-06-21 NOTE — Interval H&P Note (Signed)
History and Physical Interval Note:  06/21/2017 7:16 AM  Corey Clarke  has presented today for surgery, with the diagnosis of DJD right knee  The various methods of treatment have been discussed with the patient and family. After consideration of risks, benefits and other options for treatment, the patient has consented to  Procedure(s) with comments: RIGHT TOTAL KNEE ARTHROPLASTY (Right) - 120 mins as a surgical intervention .  The patient's history has been reviewed, patient examined, no change in status, stable for surgery.  I have reviewed the patient's chart and labs.  Questions were answered to the patient's satisfaction.     Yu Peggs C

## 2017-06-21 NOTE — Brief Op Note (Signed)
06/21/2017  9:22 AM  PATIENT:  Tawanna Sat  61 y.o. male  PRE-OPERATIVE DIAGNOSIS:  degenerative joint disease right knee  POST-OPERATIVE DIAGNOSIS:  degenerative joint disease right knee  PROCEDURE:  Procedure(s) with comments: RIGHT TOTAL KNEE ARTHROPLASTY (Right) - 120 mins  SURGEON:  Surgeon(s) and Role:    Susa Day, MD - Primary  PHYSICIAN ASSISTANT:   ASSISTANTS: Bissell   ANESTHESIA:   spinal  EBL:  Total I/O In: 1200 [I.V.:1200] Out: 250 [Urine:150; Blood:100]  BLOOD ADMINISTERED:none  DRAINS: none   LOCAL MEDICATIONS USED:  MARCAINE     SPECIMEN:  No Specimen  DISPOSITION OF SPECIMEN:  N/A  COUNTS:  YES  TOURNIQUET:   Total Tourniquet Time Documented: Thigh (Right) - 67 minutes Total: Thigh (Right) - 67 minutes   DICTATION: .Other Dictation: Dictation Number [id number]  PLAN OF CARE: Admit to inpatient   PATIENT DISPOSITION:  PACU - hemodynamically stable.   Delay start of Pharmacological VTE agent (>24hrs) due to surgical blood loss or risk of bleeding: no

## 2017-06-21 NOTE — Anesthesia Procedure Notes (Signed)
Spinal  Patient location during procedure: OR Start time: 06/21/2017 7:28 AM End time: 06/21/2017 7:29 AM Staffing Anesthesiologist: Catalina Gravel Performed: anesthesiologist  Preanesthetic Checklist Completed: patient identified, surgical consent, pre-op evaluation, timeout performed, IV checked, risks and benefits discussed and monitors and equipment checked Spinal Block Patient position: sitting Prep: site prepped and draped and DuraPrep Patient monitoring: continuous pulse ox and blood pressure Approach: midline Location: L3-4 Injection technique: single-shot Needle Needle type: Pencan  Needle gauge: 24 G Assessment Sensory level: T6 Additional Notes Functioning IV was confirmed and monitors were applied. Sterile prep and drape, including hand hygiene, mask and sterile gloves were used. The patient was positioned and the spine was prepped. The skin was anesthetized with lidocaine.  Free flow of clear CSF was obtained prior to injecting local anesthetic into the CSF.  The spinal needle aspirated freely following injection.  The needle was carefully withdrawn.  The patient tolerated the procedure well. Consent was obtained prior to procedure with all questions answered and concerns addressed. Risks including but not limited to bleeding, infection, nerve damage, paralysis, failed block, inadequate analgesia, allergic reaction, high spinal, itching and headache were discussed and the patient wished to proceed.   Hoy Morn, MD

## 2017-06-21 NOTE — Anesthesia Procedure Notes (Signed)
Anesthesia Regional Block: Adductor canal block   Pre-Anesthetic Checklist: ,, timeout performed, Correct Patient, Correct Site, Correct Laterality, Correct Procedure, Correct Position, site marked, Risks and benefits discussed,  Surgical consent,  Pre-op evaluation,  At surgeon's request and post-op pain management  Laterality: Right  Prep: chloraprep       Needles:  Injection technique: Single-shot  Needle Type: Echogenic Needle     Needle Length: 9cm  Needle Gauge: 21     Additional Needles:   Procedures: ultrasound guided,,,,,,,,  Narrative:  Start time: 06/21/2017 6:56 AM End time: 06/21/2017 7:01 AM Injection made incrementally with aspirations every 5 mL.  Performed by: Personally  Anesthesiologist: Catalina Gravel  Additional Notes: No pain on injection. No increased resistance to injection. Injection made in 5cc increments.  Good needle visualization.  Patient tolerated procedure well.

## 2017-06-21 NOTE — Evaluation (Signed)
Physical Therapy Evaluation Patient Details Name: Corey Clarke MRN: [id number] DOB: [birth year] Today's Date: 06/21/2017   History of Present Illness  Pt s/p R TKR and with hx of spinal fusion 1/18  Clinical Impression  Pt s/p R TKR and presents with decreased R LE strength/ROM and post op pain limiting functional mobility.  Pt should progress to dc home with family assist.   Follow Up Recommendations Home health PT    Equipment Recommendations  None recommended by PT    Recommendations for Other Services OT consult     Precautions / Restrictions Precautions Precautions: Knee;Fall Required Braces or Orthoses: Knee Immobilizer - Right Knee Immobilizer - Right: Discontinue once straight leg raise with < 10 degree lag Restrictions Weight Bearing Restrictions: No Other Position/Activity Restrictions: WBAT      Mobility  Bed Mobility Overal bed mobility: Needs Assistance Bed Mobility: Supine to Sit;Sit to Supine     Supine to sit: Min assist Sit to supine: Min assist   General bed mobility comments: cues for sequence and use of L LE to self assist  Transfers Overall transfer level: Needs assistance Equipment used: Rolling walker (2 wheeled) Transfers: Sit to/from Stand Sit to Stand: Min assist         General transfer comment: cues for LE management and use of UEs to self assist  Ambulation/Gait Ambulation/Gait assistance: Min assist Ambulation Distance (Feet): 35 Feet Assistive device: Rolling walker (2 wheeled) Gait Pattern/deviations: Step-to pattern;Decreased step length - right;Decreased step length - left;Shuffle;Trunk flexed Gait velocity: decr Gait velocity interpretation: Below normal speed for age/gender General Gait Details: cues for sequence, posture and position from [employer]            Wheelchair Mobility    Modified Rankin (Stroke Patients Only)       Balance                                              Pertinent Vitals/Pain Pain Assessment: 0-10 Pain Score: 6  Pain Location: R knee Pain Descriptors / Indicators: Aching;Sore Pain Intervention(s): Limited activity within patient's tolerance;Monitored during session;Premedicated before session;Ice applied    Home Living Family/patient expects to be discharged to:: Private residence Living Arrangements: Spouse/significant other Available Help at Discharge: Family Type of Home: House Home Access: Stairs to enter Entrance Stairs-Rails: None Entrance Stairs-Number of Steps: 2 Home Layout: One level Home Equipment: Environmental consultant - 2 wheels;Walker - 4 wheels      Prior Function Level of Independence: Independent               Hand Dominance        Extremity/Trunk Assessment   Upper Extremity Assessment Upper Extremity Assessment: Overall WFL for tasks assessed    Lower Extremity Assessment Lower Extremity Assessment: RLE deficits/detail    Cervical / Trunk Assessment Cervical / Trunk Assessment: Normal  Communication   Communication: No difficulties  Cognition Arousal/Alertness: Awake/alert Behavior During Therapy: WFL for tasks assessed/performed Overall Cognitive Status: Within Functional Limits for tasks assessed                                        General Comments      Exercises Total Joint Exercises Ankle Circles/Pumps: AROM;Both;15 reps;Supine   Assessment/Plan    PT Assessment  Patient needs continued PT services  PT Problem List Decreased strength;Decreased range of motion;Decreased activity tolerance;Decreased mobility;Decreased knowledge of use of DME;Pain       PT Treatment Interventions DME instruction;Gait training;Stair training;Functional mobility training;Therapeutic activities;Therapeutic exercise;Patient/family education    PT Goals (Current goals can be found in the Care Plan section)  Acute Rehab PT Goals Patient Stated Goal: Regain IND and walk without pain PT Goal  Formulation: With patient Time For Goal Achievement: 06/25/17 Potential to Achieve Goals: Good    Frequency 7X/week   Barriers to discharge        Co-evaluation               AM-PAC PT "6 Clicks" Daily Activity  Outcome Measure Difficulty turning over in bed (including adjusting bedclothes, sheets and blankets)?: Total Difficulty moving from lying on back to sitting on the side of the bed? : Total Difficulty sitting down on and standing up from a chair with arms (e.g., wheelchair, bedside commode, etc,.)?: Total Help needed moving to and from a bed to chair (including a wheelchair)?: A Lot Help needed walking in hospital room?: A Little Help needed climbing 3-5 steps with a railing? : A Lot 6 Click Score: 10    End of Session Equipment Utilized During Treatment: Gait belt;Right knee immobilizer Activity Tolerance: Patient tolerated treatment well Patient left: in bed;with call bell/phone within reach;with family/visitor present Nurse Communication: Mobility status PT Visit Diagnosis: Difficulty in walking, not elsewhere classified (R26.2)    Time: 6606-3016 PT Time Calculation (min) (ACUTE ONLY): 31 min   Charges:   PT Evaluation $PT Eval Low Complexity: 1 Low PT Treatments $Gait Training: 8-22 mins   PT G Codes:        Pg [phone]   Bahja Bence 06/21/2017, 5:27 PM

## 2017-06-21 NOTE — H&P (View-Only) (Signed)
Tawanna Sat DOB: 1956-03-12 Married / Language: Cleophus Molt / Race: White Male  H&P date: 06/18/17  Chief Complaint: Right knee pain  History of Present Illness The patient is a 61 year old male who comes in today for a preoperative History and Physical. The patient is scheduled for a right total knee arthroplasty to be performed by Dr. Johnn Hai, MD at [facility] on June 21, 2017 .   Yvone Neu reports chronic pain right knee, progressively worsening over time, refractory to conservative treatment including injection therapy, bracing, activity modifications, quad strengthening, medication, relative rest. Pain is interfering with his activities of daily living and quality of life at this point and he desires to proceed with surgery. His wife is here with him for H&P today. He did already have his [facility] preop appointment.  Dr. Tonita Cong and the patient mutually agreed to proceed with a right total knee replacement. Risks and benefits of the procedure were discussed including stiffness, suboptimal range of motion, persistent pain, infection requiring removal of prosthesis and reinsertion, need for prophylactic antibiotics in the future, for example, dental procedures, possible need for manipulation, revision in the future and also anesthetic complications including DVT, PE, etc. We discussed the perioperative course, time in the hospital, postoperative recovery and the need for elevation to control swelling. We also discussed the predicted range of motion and the probability that squatting and kneeling would be unobtainable in the future. In addition, postoperative anticoagulation was discussed. We have obtained preoperative medical clearance as necessary. Provided illustrated handout and discussed it in detail. They will enroll in the total joint replacement educational forum at the hospital.  Problem List/Past Medical Hx Primary osteoarthritis of left knee (M17.12)  Primary  osteoarthritis of right hip (M16.11)  Bilateral knee pain (M25.561, M25.562)  Hypercholesterolemia   Allergies No Known Drug Allergies [02/02/2014]:  Family History Heart Disease  Father, Paternal Grandfather. Depression  Paternal Grandmother. Cerebrovascular Accident  Father. First Degree Relatives   Social History Tobacco use  Never smoker. 02/02/2014 Marital status  married No history of drug/alcohol rehab  Exercise  Exercises monthly; does running / walking Living situation  live with spouse Not under pain contract  Number of flights of stairs before winded  4-5 Current work status  working full time Children  1 Current drinker  02/02/2014: Currently drinks beer 5-7 times per week  Medication History Diclofenac Sodium (75MG  Tablet DR, Oral) Active. Escitalopram Oxalate (20MG  Tablet, Oral) Active. TiZANidine HCl (4MG  Tablet, Oral) Active. Atorvastatin Calcium (10MG  Tablet, Oral) Active. Norco (5-325MG  Tablet, Oral) Active. Medications Reconciled  Past Surgical History Carpal Tunnel Repair  left Spinal fusion 11/2016 Dr. Carloyn Manner Left elbow (staph infx)  Vitals 06/18/2017 8:58 AM Weight: 201 lb Height: 68.5in Body Surface Area: 2.06 m Body Mass Index: 30.12 kg/m  BP: 162/91 (Sitting, Right Arm, Standard)  Physical Exam General Mental Status -Alert, cooperative and good historian. General Appearance-pleasant, Not in acute distress. Orientation-Oriented X3. Build & Nutrition-Well nourished and Well developed. Gait-Antalgic. Note: Varus thrust right knee  Head and Neck Head-normocephalic, atraumatic . Neck Global Assessment - supple, no bruit auscultated on the right, no bruit auscultated on the left.  Eye Pupil - Bilateral-Regular and Round. Motion - Bilateral-EOMI.  Chest and Lung Exam Auscultation Breath sounds - clear at anterior chest wall and clear at posterior chest wall. Adventitious sounds - No  Adventitious sounds.  Cardiovascular Auscultation Rhythm - Regular rate and rhythm. Heart Sounds - S1 WNL and S2 WNL. Murmurs & Other  Heart Sounds - Auscultation of the heart reveals - No Murmurs.  Abdomen Palpation/Percussion Tenderness - Abdomen is non-tender to palpation. Rigidity (guarding) - Abdomen is soft. Auscultation Auscultation of the abdomen reveals - Bowel sounds normal.  Male Genitourinary Not done, not pertinent to present illness  Musculoskeletal On exam, he has got a varus deformity. He is exquisitely tender in medial joint line. Patellofemoral pain with compression. Trace effusion. Range is 0 to 140.  Imaging AP standing and lateral demonstrate bone on bone arthrosis of medial compartment bilaterally and patellofemoral arthrosis as well.  Assessment & Plan Primary osteoarthritis of right knee (M17.11)  Pt with end-stage right knee DJD, bone-on-bone, refractory to conservative tx, scheduled for right total knee replacement by Dr. Tonita Cong on August 2nd. We again discussed the procedure itself as well as risks, complications and alternatives, including but not limited to DVT, PE, infx, bleeding, failure of procedure, need for secondary procedure including manipulation, nerve injury, ongoing pain/symptoms, anesthesia risk, even stroke or death. Also discussed typical post-op protocols, activity restrictions, need for PT, flexion/extension exercises, time out of work. Discussed need for DVT ppx post-op per protocol. Discussed dental ppx and infx prevention. Also discussed limitations post-operatively such as kneeling and squatting. All questions were answered. Patient desires to proceed with surgery as scheduled.  Will hold supplements, ASA and NSAIDs accordingly. Will remain NPO after midnight the night before surgery. Will present to [facility] for pre-op testing. Anticipate hospital stay to include at least 2 midnights given medical history and to ensure proper pain control. Plan ASA  325mg  BID for DVT ppx post-op. Plan Percocet, Robaxin, Colace, Miralax. Plan home with HHPT post-op with family members at home for assistance. Will follow up 10-14 days post-op for staple removal and xrays.  Plan right total knee replacement  Signed electronically by Cecilie Kicks, PA-C for Dr. Tonita Cong

## 2017-06-21 NOTE — Anesthesia Procedure Notes (Signed)
Date/Time: 06/21/2017 6:59 AM Performed by: Claudia Desanctis Oxygen Delivery Method: Nasal cannula

## 2017-06-22 LAB — CBC
HCT: 40 % (ref 39.0–52.0)
Hemoglobin: 13.8 g/dL (ref 13.0–17.0)
MCH: 31.6 pg (ref 26.0–34.0)
MCHC: 34.5 g/dL (ref 30.0–36.0)
MCV: 91.5 fL (ref 78.0–100.0)
PLATELETS: 278 10*3/uL (ref 150–400)
RBC: 4.37 MIL/uL (ref 4.22–5.81)
RDW: 13.2 % (ref 11.5–15.5)
WBC: 17.8 10*3/uL — AB (ref 4.0–10.5)

## 2017-06-22 LAB — BASIC METABOLIC PANEL
ANION GAP: 7 (ref 5–15)
BUN: 12 mg/dL (ref 6–20)
CO2: 28 mmol/L (ref 22–32)
CREATININE: 0.72 mg/dL (ref 0.61–1.24)
Calcium: 8.9 mg/dL (ref 8.9–10.3)
Chloride: 106 mmol/L (ref 101–111)
GFR calc Af Amer: >60 mL/min (ref >60)
GFR calc non Af Amer: >60 mL/min (ref >60)
Glucose, Bld: 143 mg/dL — ABNORMAL HIGH (ref 65–99)
POTASSIUM: 4.6 mmol/L (ref 3.5–5.1)
SODIUM: 141 mmol/L (ref 135–145)

## 2017-06-22 NOTE — Progress Notes (Signed)
Discharge planning, spoke with patient at bedside. Have chosen [facility] for HH PT. Contacted [facility] for referral. Has RW and 3n1. [phone] 

## 2017-06-22 NOTE — Op Note (Signed)
NAME:  Corey Clarke, BEDNARCZYK NO.:  [id number]  MEDICAL RECORD NO.:  [id number]  LOCATION:                                 FACILITY:  PHYSICIAN:  Susa Day, M.D.    DATE OF BIRTH:  Jul 17, 1956  DATE OF PROCEDURE:  06/21/2017 DATE OF DISCHARGE:                              OPERATIVE REPORT   PREOPERATIVE DIAGNOSIS:  End-stage osteoarthrosis and varus deformity of the right knee.  POSTOPERATIVE DIAGNOSIS:  End-stage osteoarthrosis and varus deformity of the right knee.  PROCEDURE PERFORMED:  Right total knee arthroplasty utilizing the DePuy Attune rotating platform, 7 femur, 7 tibia, 6 insert, 42 patella.  ANESTHESIA:  Spinal.  ASSISTANT:  Cleophas Dunker, PA.  HISTORY:  A 61, bone-on-bone arthrosis, medial compartment and varus deformity refractory to conservative treatment, significant negative effect to his activities of daily living.  Unable to ambulate.  Bone-on- bone arthrosis, indicated for replacement of the degenerated joint. Risks and benefits discussed including bleeding, infection, damage to the neurovascular structures, suboptimal range of motion, DVT, PE, anesthetic complication, component loosening, need for revision, etc.  TECHNIQUE:  With the patient in supine position after induction of adequate spinal anesthesia and adductor block, the right lower extremity was prepped, draped, and exsanguinated in usual sterile fashion.  Thigh tourniquet was inflated to 275 mmHg.  Midline incision was then made over the patella.  Anteriorly, full-thickness flaps developed.  Median parapatellar arthrotomy performed.  Patella was gently everted.  Knee flexed.  Tricompartmental severe osteoarthrosis was noted.  We elevated soft tissue medially preserving the MCL.  Osteophytes removed with a Beyer rongeur.  Remnants of medial and lateral menisci were removed. Geniculate was cauterized.  Bone-on-bone tricompartmental was noted. Osteophytes were removed.  Step  drill utilized and the femoral canal was irrigated, 5-degree right with 9 off the distal femur was selected, pinned and distal cut performed.  Then measured off the anterior cortex to a 7 with 3 degrees external rotation.  We pinned the guide and performed anterior, posterior, and chamfer cuts through the soft tissue well protected with Cregos posteriorly.  Then, we subluxed the tibia, recessed PCL.  Low point was medially, 2 off the defect, which was medially bisecting tibiotalar joint.  3-degree slope parallel to the shaft, which was 10 off the high side.  This was then pinned, then we performed the proximal tibia cut with an oscillating saw.  We then checked our extension gap, and it was adequate at 6 mm.  We then flexed the knee and completed the tibia, it was sized to a 7 max maximal coverage, just the medial aspect of tibial tubercle pinned.  We harvested bone from the tibial canal for the femoral bone plug, then we placed a punch guide after central drilling.  Then, attention was turned towards the femur.  Box cut was performed bisecting the canal.  This was an oscillating saw.  We then placed our trial 7 femur, drilled our lug holes, placed a 6 mm insert and then reduced the knee.  We had full extension, full flexion, good stability, varus and valgus stressing was 0 and 30 degrees.  Attention turned towards the patella, was measured to a  26, planed to a 16 utilizing the patellar jig parallel.  Multiple osteophytes were removed.  We drilled our peg holes after sizing it to a 42, medializing them.  Trial patella was placed, reduced with excellent patellofemoral tracking.  All trials were then removed.  We checked posteriorly remnants of any menisci were removed.  Popliteus was intact as well as the capsule.  Copiously irrigated with pulsatile lavage.  Flexed the knee, all surfaces thoroughly dried.  Mixed cement on the back table in appropriate fashion, injected in the tibia and  digitally pressurized it. We impacted our 7 tibial tray, redundant cement removed.  We cemented and impacted our femur, redundant cement removed after the bone plug had been placed in the femur.  We placed the trial 6, reduced and held an axial load throughout the curing of the cement, redundant cement removed.  Cemented and clamped the patella.  After curing the cement, tourniquet was deflated, it is 67 minutes after injected with 0.25% Marcaine with epinephrine for hemostasis.  Any bleeding was cauterized. The knee was flexed.  Redundant cement removed with an osteotome.  I felt a 6 was at satisfactory, removed it, placed a permanent 6 after copious irrigation with antibiotic irrigation and then we reduced the patella with excellent patellofemoral tracking.  Full extension, full flexion, good stability, varus and valgus stressing at 0 to 30 degrees. Negative anterior drawer.  I then with slight flexion reapproximated the patellar arthrotomy with #1 Vicryl interrupted figure-of-eight sutures and then oversewn with a running Stratafix.  Following that, we had full flexion and good extension.  An excellent patellofemoral tracking.  There was copious irrigation as well.  Subcu with 2 and skin with Monocryl.  Sterile dressing applied.  He had flexion to gravity at 90 degrees and again excellent patellofemoral tracking and good stability.  The patient was then sterile dressing applied and immobilizer, placed on the hospital bed, and transported to the recovery room in satisfactory condition.  The patient tolerated the procedure well.  No complications.  Tourniquet time 67 minutes.  Cleophas Dunker, [place], was used throughout the case for patient positioning, closure, and exposure.     Susa Day, M.D.     Geralynn Rile  D:  06/21/2017  T:  06/21/2017  Job:  [id number]

## 2017-06-22 NOTE — Progress Notes (Signed)
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  RN aware.   Follow Up Recommendations  Home health PT     Equipment Recommendations  None recommended by PT    Recommendations for Other Services OT consult     Precautions / Restrictions Precautions Precautions: Knee;Fall Precaution Comments: pt doing SLR Required Braces or Orthoses: Knee Immobilizer - Right Knee Immobilizer - Right: Discontinue once straight leg raise with < 10 degree lag Restrictions Weight Bearing Restrictions: No Other Position/Activity Restrictions: WBAT    Mobility  Bed Mobility                  Transfers Overall transfer level: Needs assistance Equipment used: Rolling walker (2 wheeled) Transfers: Sit to/from Stand Sit to Stand: Min guard         General transfer comment: for safety. Cues for UE/LE placement  Ambulation/Gait Ambulation/Gait assistance: Min guard Ambulation Distance (Feet): 75 Feet Assistive device: Rolling walker (2 wheeled) Gait Pattern/deviations: Step-to pattern;Decreased step length - right;Decreased step length - left;Shuffle;Trunk flexed Gait velocity: decr Gait velocity interpretation: Below normal speed for age/gender General Gait Details: cues for sequence, posture and position from [employer]            Wheelchair Mobility    Modified Rankin (Stroke Patients Only)       Balance                                            Cognition Arousal/Alertness: Awake/alert Behavior During Therapy: WFL for tasks assessed/performed Overall Cognitive Status: Within Functional Limits for tasks assessed                                        Exercises      General Comments        Pertinent  Vitals/Pain Pain Assessment: 0-10 Pain Score: 7  Pain Location: R knee Pain Descriptors / Indicators: Aching;Sore Pain Intervention(s): Limited activity within patient's tolerance;Monitored during session;Premedicated before session;Ice applied    Home Living                      Prior Function            PT Goals (current goals can now be found in the care plan section) Acute Rehab PT Goals Patient Stated Goal: Regain IND and walk without pain PT Goal Formulation: With patient Time For Goal Achievement: 06/25/17 Potential to Achieve Goals: Good Progress towards PT goals: Progressing toward goals    Frequency    7X/week      PT Plan Current plan remains appropriate    Co-evaluation              AM-PAC PT "6 Clicks" Daily Activity  Outcome Measure  Difficulty turning over in bed (including adjusting bedclothes, sheets and blankets)?: A Lot Difficulty moving from lying on back to sitting on the side of the bed? : A Lot Difficulty sitting down on and standing up from a chair with arms (e.g., wheelchair, bedside commode,  etc,.)?: A Little Help needed moving to and from a bed to chair (including a wheelchair)?: A Little Help needed walking in hospital room?: A Little Help needed climbing 3-5 steps with a railing? : A Lot 6 Click Score: 15    End of Session Equipment Utilized During Treatment: Gait belt;Right knee immobilizer Activity Tolerance: Patient tolerated treatment well Patient left: with call bell/phone within reach;with family/visitor present;in chair Nurse Communication: Mobility status PT Visit Diagnosis: Difficulty in walking, not elsewhere classified (R26.2)     Time: 0722-5750 PT Time Calculation (min) (ACUTE ONLY): 15 min  Charges:  $Gait Training: 8-22 mins                    G Codes:       Pg [phone]    Mintie Witherington 06/22/2017, 5:38 PM

## 2017-06-22 NOTE — Progress Notes (Signed)
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 1/18    PT Comments    Pt motivated and progressing well with mobility   Follow Up Recommendations  Home health PT     Equipment Recommendations  None recommended by PT    Recommendations for Other Services OT consult     Precautions / Restrictions Precautions Precautions: Knee;Fall Precaution Comments: pt doing SLR Restrictions Weight Bearing Restrictions: No Other Position/Activity Restrictions: WBAT    Mobility  Bed Mobility Overal bed mobility: Needs Assistance Bed Mobility: Supine to Sit     Supine to sit: Min guard     General bed mobility comments: cues for sequence and use of L LE to self assist  Transfers Overall transfer level: Needs assistance Equipment used: Rolling walker (2 wheeled) Transfers: Sit to/from Stand Sit to Stand: Min guard         General transfer comment: for safety. Cues for UE/LE placement  Ambulation/Gait Ambulation/Gait assistance: Min assist Ambulation Distance (Feet): 90 Feet Assistive device: Rolling walker (2 wheeled) Gait Pattern/deviations: Step-to pattern;Decreased step length - right;Decreased step length - left;Shuffle;Trunk flexed Gait velocity: decr Gait velocity interpretation: Below normal speed for age/gender General Gait Details: cues for sequence, posture and position from [employer]            Wheelchair Mobility    Modified Rankin (Stroke Patients Only)       Balance                                            Cognition Arousal/Alertness: Awake/alert Behavior During Therapy: WFL for tasks assessed/performed Overall Cognitive Status: Within Functional Limits for tasks assessed                                        Exercises Total Joint Exercises Ankle  Circles/Pumps: AROM;Both;15 reps;Supine Quad Sets: AROM;Both;10 reps;Supine Heel Slides: AAROM;Right;15 reps;Supine Straight Leg Raises: AAROM;AROM;Right;10 reps;Supine Goniometric ROM: AAROM R knee -10 - 75    General Comments        Pertinent Vitals/Pain Pain Assessment: 0-10 Pain Score: 6  Pain Location: R knee Pain Descriptors / Indicators: Aching;Sore Pain Intervention(s): Limited activity within patient's tolerance;Monitored during session;Premedicated before session;Ice applied;Patient requesting pain meds-RN notified    Home Living Family/patient expects to be discharged to:: Private residence Living Arrangements: Spouse/significant other Available Help at Discharge: Family         Home Equipment: Bedside commode (AE kit)      Prior Function Level of Independence: Independent          PT Goals (current goals can now be found in the care plan section) Acute Rehab PT Goals Patient Stated Goal: Regain IND and walk without pain PT Goal Formulation: With patient Time For Goal Achievement: 06/25/17 Potential to Achieve Goals: Good Progress towards PT goals: Progressing toward goals    Frequency    7X/week      PT Plan Current plan remains appropriate    Co-evaluation              AM-PAC PT "6 Clicks" Daily Activity  Outcome Measure  Difficulty turning over in  bed (including adjusting bedclothes, sheets and blankets)?: A Lot Difficulty moving from lying on back to sitting on the side of the bed? : A Lot Difficulty sitting down on and standing up from a chair with arms (e.g., wheelchair, bedside commode, etc,.)?: A Little Help needed moving to and from a bed to chair (including a wheelchair)?: A Little Help needed walking in hospital room?: A Little Help needed climbing 3-5 steps with a railing? : A Lot 6 Click Score: 15    End of Session Equipment Utilized During Treatment: Gait belt;Right knee immobilizer Activity Tolerance: Patient tolerated  treatment well Patient left: with call bell/phone within reach;with family/visitor present;in chair Nurse Communication: Mobility status PT Visit Diagnosis: Difficulty in walking, not elsewhere classified (R26.2)     Time: 4982-6415 PT Time Calculation (min) (ACUTE ONLY): 33 min  Charges:  $Gait Training: 8-22 mins $Therapeutic Exercise: 8-22 mins                    G Codes:       Pg [phone]    Lavine Hargrove 06/22/2017, 10:19 AM

## 2017-06-22 NOTE — Evaluation (Signed)
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 All education was completed. No further OT is needed at this time     Follow Up Recommendations  Supervision/Assistance - 24 hour    Equipment Recommendations  None recommended by OT    Recommendations for Other Services       Precautions / Restrictions Precautions Precautions: Knee;Fall Precaution Comments: pt doing SLR Restrictions Weight Bearing Restrictions: No      Mobility Bed Mobility               General bed mobility comments: oob with PT  Transfers   Equipment used: Rolling walker (2 wheeled)   Sit to Stand: Min guard         General transfer comment: for safety. Cues for UE/LE placement    Balance                                           ADL either performed or assessed with clinical judgement   ADL Overall ADL's : Needs assistance/impaired     Grooming: Supervision/safety;Standing   Upper Body Bathing: Set up;Sitting   Lower Body Bathing: Min guard;Sit to/from stand;With adaptive equipment   Upper Body Dressing : Set up;Sitting   Lower Body Dressing: Minimal assistance;Sit to/from stand;With adaptive equipment   Toilet Transfer: Min guard;Ambulation;Grab bars;RW             General ADL Comments: reviewed tub options with 3:1 vs tub bench.  He is not ready to step over tub at this time. Pt has AE from back sx.  Educated on knee precautions as well as Insurance underwriter      Pertinent Vitals/Pain Pain Assessment: 0-10 Pain Score: 6  Pain Location: R knee Pain Descriptors / Indicators: Aching;Sore Pain Intervention(s): Limited activity within patient's tolerance;Monitored during  session;Premedicated before session;Repositioned;Ice applied     Hand Dominance     Extremity/Trunk Assessment Upper Extremity Assessment Upper Extremity Assessment: Overall WFL for tasks assessed           Communication Communication Communication: No difficulties   Cognition Arousal/Alertness: Awake/alert Behavior During Therapy: WFL for tasks assessed/performed Overall Cognitive Status: Within Functional Limits for tasks assessed                                     General Comments       Exercises     Shoulder Instructions      Home Living Family/patient expects to be discharged to:: Private residence Living Arrangements: Spouse/significant other Available Help at Discharge: Family               Bathroom Shower/Tub: Teacher, early years/pre: Standard     Home Equipment: Bedside commode (AE kit)          Prior Functioning/Environment Level of Independence: Independent                 OT Problem List:  OT Treatment/Interventions:      OT Goals(Current goals can be found in the care plan section) Acute Rehab OT Goals Patient Stated Goal: Regain IND and walk without pain OT Goal Formulation: All assessment and education complete, DC therapy  OT Frequency:     Barriers to D/C:            Co-evaluation              AM-PAC PT "6 Clicks" Daily Activity     Outcome Measure Help from another person eating meals?: None Help from another person taking care of personal grooming?: A Little Help from another person toileting, which includes using toliet, bedpan, or urinal?: A Little Help from another person bathing (including washing, rinsing, drying)?: A Little Help from another person to put on and taking off regular upper body clothing?: A Little Help from another person to put on and taking off regular lower body clothing?: A Little 6 Click Score: 19   End of Session    Activity Tolerance: Patient  tolerated treatment well Patient left: in chair;with call bell/phone within reach  OT Visit Diagnosis: Pain Pain - Right/Left: Right Pain - part of body: Knee                Time: 2353-6144 OT Time Calculation (min): 23 min Charges:  OT General Charges $OT Visit: 1 Procedure OT Evaluation $OT Eval Low Complexity: 1 Procedure G-Codes:     [place], OTR/L [phone] 06/22/2017  Corey Clarke 06/22/2017, 9:46 AM

## 2017-06-22 NOTE — Progress Notes (Signed)
Patient ID: Corey Clarke, male   DOB: Oct 28, 1956, 61 y.o.   MRN: [id number] Subjective: 1 Day Post-Op Procedure(s) (LRB): RIGHT TOTAL KNEE ARTHROPLASTY (Right) Patient reports pain as mild.    Patient has complaints of knee pain, better controlled this AM. No numbness or tingling. No N/V. No other c/o.  We will start therapy today. Plan is to go home with HHPT after hospital stay.  Objective: Vital signs in last 24 hours: Temp:  [97.2 F (36.2 C)-98.1 F (36.7 C)] 97.2 F (36.2 C) (08/03 0623) Pulse Rate:  [67-81] 72 (08/03 0623) Resp:  [14-17] 14 (08/03 0623) BP: (117-153)/(68-92) 124/92 (08/03 0623) SpO2:  [93 %-100 %] 94 % (08/03 0623)  Intake/Output from previous day:  Intake/Output Summary (Last 24 hours) at 06/22/17 0755 Last data filed at 06/22/17 7829  Gross per 24 hour  Intake             3950 ml  Output             5500 ml  Net            -1550 ml    Intake/Output this shift: No intake/output data recorded.  Labs: Results for orders placed or performed during the hospital encounter of 06/21/17  CBC  Result Value Ref Range   WBC 17.8 (H) 4.0 - 10.5 K/uL   RBC 4.37 4.22 - 5.81 MIL/uL   Hemoglobin 13.8 13.0 - 17.0 g/dL   HCT 40.0 39.0 - 52.0 %   MCV 91.5 78.0 - 100.0 fL   MCH 31.6 26.0 - 34.0 pg   MCHC 34.5 30.0 - 36.0 g/dL   RDW 13.2 11.5 - 15.5 %   Platelets 278 150 - 400 K/uL  Basic metabolic panel  Result Value Ref Range   Sodium 141 135 - 145 mmol/L   Potassium 4.6 3.5 - 5.1 mmol/L   Chloride 106 101 - 111 mmol/L   CO2 28 22 - 32 mmol/L   Glucose, Bld 143 (H) 65 - 99 mg/dL   BUN 12 6 - 20 mg/dL   Creatinine, Ser 0.72 0.61 - 1.24 mg/dL   Calcium 8.9 8.9 - 10.3 mg/dL   GFR calc non Af Amer >60 >60 mL/min   GFR calc Af Amer >60 >60 mL/min   Anion gap 7 5 - 15  Type and screen [place]  Result Value Ref Range   ABO/RH(D) A POS    Antibody Screen NEG    Sample Expiration 06/24/2017   ABO/Rh  Result Value Ref Range   ABO/RH(D) A POS     Exam - Neurologically intact ABD soft Neurovascular intact Sensation intact distally Intact pulses distally Dorsiflexion/Plantar flexion intact Incision: dressing C/D/I and no drainage No cellulitis present Compartment soft no sign of DVT Dressing - clean, dry, no drainage Motor function intact - moving foot and toes well on exam.   Assessment/Plan: 1 Day Post-Op Procedure(s) (LRB): RIGHT TOTAL KNEE ARTHROPLASTY (Right)  Advance diet Up with therapy D/C IV fluids Past Medical History:  Diagnosis Date  . Arthritis   . Complication of anesthesia   . PONV (postoperative nausea and vomiting)    and abd pain    DVT Prophylaxis - ASA 325mg  BID Protocol Weight-Bearing as tolerated to right leg No vaccines. Plan D/C home with HHPT when ready- tomorrow vs. Sunday depending on progress with PT and pain control, anticipate tomorrow Will discuss with Dr. Mliss Fritz, [place]. 06/22/2017, 7:55 AM

## 2017-06-23 LAB — CBC
HEMATOCRIT: 39.3 % (ref 39.0–52.0)
Hemoglobin: 13.2 g/dL (ref 13.0–17.0)
MCH: 31.5 pg (ref 26.0–34.0)
MCHC: 33.6 g/dL (ref 30.0–36.0)
MCV: 93.8 fL (ref 78.0–100.0)
Platelets: 263 10*3/uL (ref 150–400)
RBC: 4.19 MIL/uL — ABNORMAL LOW (ref 4.22–5.81)
RDW: 13.4 % (ref 11.5–15.5)
WBC: 14.6 10*3/uL — ABNORMAL HIGH (ref 4.0–10.5)

## 2017-06-23 NOTE — Progress Notes (Signed)
Physical Therapy Treatment Patient Details Name: Corey Clarke MRN: [id number] DOB: 1956-01-25 Today's Date: 06/23/2017    History of Present Illness Pt s/p R TKR and with hx of spinal fusion 1/18    PT Comments    Pt progressing steadily with mobility and eager for return home.  Reviewed home therex, stairs, don/doff KI and car transfers with pt and spouse.   Follow Up Recommendations  Home health PT     Equipment Recommendations  None recommended by PT    Recommendations for Other Services OT consult     Precautions / Restrictions Precautions Precautions: Knee;Fall Required Braces or Orthoses: Knee Immobilizer - Right Knee Immobilizer - Right: Discontinue once straight leg raise with < 10 degree lag Restrictions Weight Bearing Restrictions: No Other Position/Activity Restrictions: WBAT    Mobility  Bed Mobility               General bed mobility comments: Pt OOB  Transfers Overall transfer level: Needs assistance Equipment used: Rolling walker (2 wheeled) Transfers: Sit to/from Stand Sit to Stand: Supervision         General transfer comment: for safety. Cues for UE/LE placement  Ambulation/Gait Ambulation/Gait assistance: Min guard;Supervision Ambulation Distance (Feet): 90 Feet Assistive device: Rolling walker (2 wheeled) Gait Pattern/deviations: Step-to pattern;Decreased step length - right;Decreased step length - left;Shuffle;Trunk flexed Gait velocity: decr Gait velocity interpretation: Below normal speed for age/gender General Gait Details: min cues for sequence, posture and position from RW   Stairs Stairs: Yes   Stair Management: No rails;Step to pattern;Backwards;With walker Number of Stairs: 4 General stair comments: 2 steps twice bkwd with RW and cues for sequence and foot/RW placement.  Spouse assisting on second attempt and written instruction provided  Wheelchair Mobility    Modified Rankin (Stroke Patients Only)        Balance                                            Cognition Arousal/Alertness: Awake/alert Behavior During Therapy: WFL for tasks assessed/performed Overall Cognitive Status: Within Functional Limits for tasks assessed                                        Exercises Total Joint Exercises Ankle Circles/Pumps: AROM;Both;15 reps;Supine Quad Sets: AROM;Both;Supine;15 reps Heel Slides: AAROM;Right;15 reps;Supine Straight Leg Raises: AAROM;AROM;Right;Supine;15 reps    General Comments        Pertinent Vitals/Pain Pain Assessment: 0-10 Pain Score: 5  Pain Location: R knee Pain Descriptors / Indicators: Aching;Sore Pain Intervention(s): Limited activity within patient's tolerance;Monitored during session;Ice applied;Patient requesting pain meds-RN notified    Home Living                      Prior Function            PT Goals (current goals can now be found in the care plan section) Acute Rehab PT Goals Patient Stated Goal: Regain IND and walk without pain PT Goal Formulation: With patient Time For Goal Achievement: 06/25/17 Potential to Achieve Goals: Good Progress towards PT goals: Progressing toward goals    Frequency    7X/week      PT Plan Current plan remains appropriate    Co-evaluation  AM-PAC PT "6 Clicks" Daily Activity  Outcome Measure  Difficulty turning over in bed (including adjusting bedclothes, sheets and blankets)?: A Lot Difficulty moving from lying on back to sitting on the side of the bed? : A Lot Difficulty sitting down on and standing up from a chair with arms (e.g., wheelchair, bedside commode, etc,.)?: A Little Help needed moving to and from a bed to chair (including a wheelchair)?: A Little Help needed walking in hospital room?: A Little Help needed climbing 3-5 steps with a railing? : A Little 6 Click Score: 16    End of Session Equipment Utilized During Treatment: Gait  belt;Right knee immobilizer Activity Tolerance: Patient tolerated treatment well Patient left: in chair;with call bell/phone within reach;with family/visitor present Nurse Communication: Mobility status PT Visit Diagnosis: Difficulty in walking, not elsewhere classified (R26.2)     Time: 3709-6438 PT Time Calculation (min) (ACUTE ONLY): 24 min  Charges:  $Gait Training: 8-22 mins $Therapeutic Exercise: 8-22 mins $Therapeutic Activity: 8-22 mins                    G Codes:       Pg [phone]    Kennesha Brewbaker 06/23/2017, 12:05 PM

## 2017-06-23 NOTE — Progress Notes (Signed)
Physical Therapy Treatment Patient Details Name: Corey Clarke MRN: [id number] DOB: 17-May-1956 Today's Date: 06/23/2017    History of Present Illness Pt s/p R TKR and with hx of spinal fusion 1/18    PT Comments    Pt performed therex program and ambulated to bathroom.  Will follow up with gait and stair training when spouse arrives.   Follow Up Recommendations  Home health PT     Equipment Recommendations  None recommended by PT    Recommendations for Other Services OT consult     Precautions / Restrictions Precautions Precautions: Knee;Fall Required Braces or Orthoses: Knee Immobilizer - Right Knee Immobilizer - Right: Discontinue once straight leg raise with < 10 degree lag Restrictions Weight Bearing Restrictions: No Other Position/Activity Restrictions: WBAT    Mobility  Bed Mobility               General bed mobility comments: Pt OOB with nursing   Transfers Overall transfer level: Needs assistance Equipment used: Rolling walker (2 wheeled) Transfers: Sit to/from Stand Sit to Stand: Supervision         General transfer comment: for safety. Cues for UE/LE placement  Ambulation/Gait Ambulation/Gait assistance: Min guard;Supervision Ambulation Distance (Feet): 20 Feet (to bathroom) Assistive device: Rolling walker (2 wheeled) Gait Pattern/deviations: Step-to pattern;Decreased step length - right;Decreased step length - left;Shuffle;Trunk flexed Gait velocity: decr Gait velocity interpretation: Below normal speed for age/gender General Gait Details: min cues for sequence, posture and position from [employer]            Wheelchair Mobility    Modified Rankin (Stroke Patients Only)       Balance                                            Cognition Arousal/Alertness: Awake/alert Behavior During Therapy: WFL for tasks assessed/performed Overall Cognitive Status: Within Functional Limits for tasks assessed                                        Exercises Total Joint Exercises Ankle Circles/Pumps: AROM;Both;15 reps;Supine Quad Sets: AROM;Both;Supine;15 reps Heel Slides: AAROM;Right;15 reps;Supine Straight Leg Raises: AAROM;AROM;Right;Supine;15 reps    General Comments        Pertinent Vitals/Pain Pain Assessment: 0-10 Pain Score: 7  Pain Location: R knee Pain Descriptors / Indicators: Aching;Sore Pain Intervention(s): Limited activity within patient's tolerance;Monitored during session;Premedicated before session;Ice applied    Home Living                      Prior Function            PT Goals (current goals can now be found in the care plan section) Acute Rehab PT Goals Patient Stated Goal: Regain IND and walk without pain PT Goal Formulation: With patient Time For Goal Achievement: 06/25/17 Potential to Achieve Goals: Good Progress towards PT goals: Progressing toward goals    Frequency    7X/week      PT Plan Current plan remains appropriate    Co-evaluation              AM-PAC PT "6 Clicks" Daily Activity  Outcome Measure  Difficulty turning over in bed (including adjusting bedclothes, sheets and blankets)?: A Lot Difficulty moving from lying  on back to sitting on the side of the bed? : A Lot Difficulty sitting down on and standing up from a chair with arms (e.g., wheelchair, bedside commode, etc,.)?: A Little Help needed moving to and from a bed to chair (including a wheelchair)?: A Little Help needed walking in hospital room?: A Little Help needed climbing 3-5 steps with a railing? : A Lot 6 Click Score: 15    End of Session Equipment Utilized During Treatment: Gait belt;Right knee immobilizer Activity Tolerance: Patient tolerated treatment well Patient left: Other (comment) (bathroom)   PT Visit Diagnosis: Difficulty in walking, not elsewhere classified (R26.2)     Time: 5916-3846 PT Time Calculation (min) (ACUTE ONLY): 19  min  Charges:  $Therapeutic Exercise: 8-22 mins                    G Codes:       Pg [phone]    Laretta Pyatt 06/23/2017, 11:53 AM

## 2017-06-23 NOTE — Progress Notes (Signed)
Pt discharged to home. DC instructions given. No concerns voiced. Prescriptions given for meds x 5. Left unit in wheelchair pushed by nurse tech. Left in good condition. Corey Clarke.

## 2017-06-23 NOTE — Progress Notes (Signed)
     Subjective: 2 Days Post-Op Procedure(s) (LRB): RIGHT TOTAL KNEE ARTHROPLASTY (Right)   Patient reports pain as mild, pain controlled.  No events throughout the night.  We have reviewed the use of ice, ACE bandage, the dressing and care after leaving the hospital.  Ready to be discharged home if he does well with PT.   Objective:   VITALS:   Vitals:   06/22/17 2054 06/23/17 0526  BP: (!) 154/85 (!) 147/80  Pulse: 80 79  Resp: 16 16  Temp: 97.9 F (36.6 C) 99 F (37.2 C)    Dorsiflexion/Plantar flexion intact Incision: dressing C/D/I No cellulitis present Compartment soft  LABS  Recent Labs  06/22/17 0527 06/23/17 0536  HGB 13.8 13.2  HCT 40.0 39.3  WBC 17.8* 14.6*  PLT 278 263     Recent Labs  06/22/17 0527  NA 141  K 4.6  BUN 12  CREATININE 0.72  GLUCOSE 143*     Assessment/Plan: 2 Days Post-Op Procedure(s) (LRB): RIGHT TOTAL KNEE ARTHROPLASTY (Right)   Up with therapy Discharge home  Follow up in 2 weeks at [facility]. Follow up with Dr Tonita Cong in 2 weeks.  Contact information:  [facility] [address], [address] [place]   PAC  06/23/2017, 8:31 AM

## 2017-06-25 NOTE — Discharge Summary (Signed)
Physician Discharge Summary   Patient ID: Corey Clarke MRN: [id number] DOB/AGE: Nov 30, 1955 61 y.o.  Admit date: 06/21/2017 Discharge date: 06/23/2017  Primary Diagnosis: primary osteoarthritis right knee  Admission Diagnoses:  Past Medical History:  Diagnosis Date  . Arthritis   . Complication of anesthesia   . PONV (postoperative nausea and vomiting)    and abd pain   Discharge Diagnoses:   Active Problems:   Right knee DJD  Estimated body mass index is 28.94 kg/m as calculated from the following:   Height as of this encounter: '5\' 9"'  (1.753 m).   Weight as of this encounter: 88.9 kg (196 lb).  Procedure:  Procedure(s) (LRB): RIGHT TOTAL KNEE ARTHROPLASTY (Right)   Consults: None  HPI: see H&P Laboratory Data: Admission on 06/21/2017, Discharged on 06/23/2017  Component Date Value Ref Range Status  . ABO/RH(D) 06/21/2017 A POS   Final  . Antibody Screen 06/21/2017 NEG   Final  . Sample Expiration 06/21/2017 06/24/2017   Final  . ABO/RH(D) 06/21/2017 A POS   Final  . WBC 06/22/2017 17.8* 4.0 - 10.5 K/uL Final  . RBC 06/22/2017 4.37  4.22 - 5.81 MIL/uL Final  . Hemoglobin 06/22/2017 13.8  13.0 - 17.0 g/dL Final  . HCT 06/22/2017 40.0  39.0 - 52.0 % Final  . MCV 06/22/2017 91.5  78.0 - 100.0 fL Final  . MCH 06/22/2017 31.6  26.0 - 34.0 pg Final  . MCHC 06/22/2017 34.5  30.0 - 36.0 g/dL Final  . RDW 06/22/2017 13.2  11.5 - 15.5 % Final  . Platelets 06/22/2017 278  150 - 400 K/uL Final  . Sodium 06/22/2017 141  135 - 145 mmol/L Final  . Potassium 06/22/2017 4.6  3.5 - 5.1 mmol/L Final  . Chloride 06/22/2017 106  101 - 111 mmol/L Final  . CO2 06/22/2017 28  22 - 32 mmol/L Final  . Glucose, Bld 06/22/2017 143* 65 - 99 mg/dL Final  . BUN 06/22/2017 12  6 - 20 mg/dL Final  . Creatinine, Ser 06/22/2017 0.72  0.61 - 1.24 mg/dL Final  . Calcium 06/22/2017 8.9  8.9 - 10.3 mg/dL Final  . GFR calc non Af Amer 06/22/2017 >60  >60 mL/min Final  . GFR calc Af Amer 06/22/2017  >60  >60 mL/min Final   Comment: (NOTE) The eGFR has been calculated using the CKD EPI equation. This calculation has not been validated in all clinical situations. eGFR's persistently <60 mL/min signify possible Chronic Kidney Disease.   . Anion gap 06/22/2017 7  5 - 15 Final  . WBC 06/23/2017 14.6* 4.0 - 10.5 K/uL Final  . RBC 06/23/2017 4.19* 4.22 - 5.81 MIL/uL Final  . Hemoglobin 06/23/2017 13.2  13.0 - 17.0 g/dL Final  . HCT 06/23/2017 39.3  39.0 - 52.0 % Final  . MCV 06/23/2017 93.8  78.0 - 100.0 fL Final  . MCH 06/23/2017 31.5  26.0 - 34.0 pg Final  . MCHC 06/23/2017 33.6  30.0 - 36.0 g/dL Final  . RDW 06/23/2017 13.4  11.5 - 15.5 % Final  . Platelets 06/23/2017 263  150 - 400 K/uL Final  Hospital Outpatient Visit on 06/07/2017  Component Date Value Ref Range Status  . aPTT 06/07/2017 26  24 - 36 seconds Final  . Sodium 06/07/2017 139  135 - 145 mmol/L Final  . Potassium 06/07/2017 4.5  3.5 - 5.1 mmol/L Final  . Chloride 06/07/2017 103  101 - 111 mmol/L Final  . CO2 06/07/2017 27  22 -  32 mmol/L Final  . Glucose, Bld 06/07/2017 99  65 - 99 mg/dL Final  . BUN 06/07/2017 17  6 - 20 mg/dL Final  . Creatinine, Ser 06/07/2017 0.78  0.61 - 1.24 mg/dL Final  . Calcium 06/07/2017 9.1  8.9 - 10.3 mg/dL Final  . GFR calc non Af Amer 06/07/2017 >60  >60 mL/min Final  . GFR calc Af Amer 06/07/2017 >60  >60 mL/min Final   Comment: (NOTE) The eGFR has been calculated using the CKD EPI equation. This calculation has not been validated in all clinical situations. eGFR's persistently <60 mL/min signify possible Chronic Kidney Disease.   . Anion gap 06/07/2017 9  5 - 15 Final  . WBC 06/07/2017 9.1  4.0 - 10.5 K/uL Final  . RBC 06/07/2017 4.85  4.22 - 5.81 MIL/uL Final  . Hemoglobin 06/07/2017 15.4  13.0 - 17.0 g/dL Final  . HCT 06/07/2017 44.2  39.0 - 52.0 % Final  . MCV 06/07/2017 91.1  78.0 - 100.0 fL Final  . MCH 06/07/2017 31.8  26.0 - 34.0 pg Final  . MCHC 06/07/2017 34.8  30.0 -  36.0 g/dL Final  . RDW 06/07/2017 13.1  11.5 - 15.5 % Final  . Platelets 06/07/2017 246  150 - 400 K/uL Final  . Prothrombin Time 06/07/2017 12.8  11.4 - 15.2 seconds Final  . INR 06/07/2017 0.96   Final  . Color, Urine 06/07/2017 YELLOW  YELLOW Final  . APPearance 06/07/2017 CLEAR  CLEAR Final  . Specific Gravity, Urine 06/07/2017 1.016  1.005 - 1.030 Final  . pH 06/07/2017 6.0  5.0 - 8.0 Final  . Glucose, UA 06/07/2017 NEGATIVE  NEGATIVE mg/dL Final  . Hgb urine dipstick 06/07/2017 NEGATIVE  NEGATIVE Final  . Bilirubin Urine 06/07/2017 NEGATIVE  NEGATIVE Final  . Ketones, ur 06/07/2017 NEGATIVE  NEGATIVE mg/dL Final  . Protein, ur 06/07/2017 NEGATIVE  NEGATIVE mg/dL Final  . Nitrite 06/07/2017 NEGATIVE  NEGATIVE Final  . Leukocytes, UA 06/07/2017 TRACE* NEGATIVE Final  . RBC / HPF 06/07/2017 0-5  0 - 5 RBC/hpf Final  . WBC, UA 06/07/2017 6-30  0 - 5 WBC/hpf Final  . Bacteria, UA 06/07/2017 NONE SEEN  NONE SEEN Final  . Squamous Epithelial / LPF 06/07/2017 NONE SEEN  NONE SEEN Final  . MRSA, PCR 06/07/2017 NEGATIVE  NEGATIVE Final  . Staphylococcus aureus 06/07/2017 NEGATIVE  NEGATIVE Final   Comment:        The Xpert SA Assay (FDA approved for NASAL specimens in patients over 34 years of age), is one component of a comprehensive surveillance program.  Test performance has been validated by [facility] for patients greater than or equal to 22 year old. It is not intended to diagnose infection nor to guide or monitor treatment.      X-Rays:Dg Knee Right Port  Result Date: 06/21/2017 CLINICAL DATA:  61 year old male status post knee arthroplasty. EXAM: PORTABLE RIGHT KNEE - 1-2 VIEW COMPARISON:  None. FINDINGS: Portable AP and cross-table lateral views of the right knee. Sequelae of total knee arthroplasty. Hardware appears intact and normally aligned. No unexpected osseous changes. Postoperative changes to the surrounding soft tissues, including small air-fluid level within the  joint, subcutaneous gas, and anterior skin staples. IMPRESSION: Right total knee arthroplasty with no adverse features. Electronically Signed   By: Genevie Ann M.D.   On: 06/21/2017 11:22    EKG:No orders found for this or any previous visit.   Hospital Course: RICCO DERSHEM is a 61  y.o. who was admitted to [facility]. They were brought to the operating room on 06/21/2017 and underwent Procedure(s): RIGHT TOTAL KNEE ARTHROPLASTY.  Patient tolerated the procedure well and was later transferred to the recovery room and then to the orthopaedic floor for postoperative care.  They were given PO and IV analgesics for pain control following their surgery.  They were given 24 hours of postoperative antibiotics of  Anti-infectives    Start     Dose/Rate Route Frequency Ordered Stop   06/21/17 1400  ceFAZolin (ANCEF) IVPB 2g/100 mL premix     2 g 200 mL/hr over 30 Minutes Intravenous Every 6 hours 06/21/17 1052 06/22/17 0211   06/21/17 0802  polymyxin B 500,000 Units, bacitracin 50,000 Units in sodium chloride 0.9 % 500 mL irrigation  Status:  Discontinued       As needed 06/21/17 0803 06/21/17 0938   06/21/17 0631  ceFAZolin (ANCEF) 2-4 GM/100ML-% IVPB    Comments:  [place], Ron   : cabinet override      06/21/17 0631 06/21/17 0727   06/21/17 0527  ceFAZolin (ANCEF) IVPB 2g/100 mL premix     2 g 200 mL/hr over 30 Minutes Intravenous On call to O.R. 06/21/17 1740 06/21/17 0737     and started on DVT prophylaxis in the form of Aspirin, TED hose and SCDs.   PT and OT were ordered for total joint protocol.  Discharge planning consulted to help with postop disposition and equipment needs.  Patient had a good night on the evening of surgery.  They started to get up OOB with therapy on day one.  Continued to work with therapy into day two.  By day two, the patient had progressed with therapy and meeting their goals.  Incision was healing well.  Patient was seen in rounds and was ready to go  home.   Diet: Regular diet Activity:WBAT Follow-up:in 10-14 days Disposition - Home Discharged Condition: good    Allergies as of 06/23/2017      Reactions   Ciprofloxacin Hives   Other Nausea And Vomiting   12-14-2016 anesthetics during spinal fusion procedure  cause nausea/vomiting      Medication List    STOP taking these medications   oxyCODONE-acetaminophen 5-325 MG tablet Commonly known as:  PERCOCET/ROXICET   tiZANidine 4 MG tablet Commonly known as:  ZANAFLEX     TAKE these medications   acetaminophen 650 MG CR tablet Commonly known as:  TYLENOL Take 1,300 mg by mouth 2 (two) times daily as needed for pain (knee pain).   aspirin EC 325 MG tablet Take 1 tablet (325 mg total) by mouth 2 (two) times daily.   atorvastatin 10 MG tablet Commonly known as:  LIPITOR Take 10 mg by mouth daily at 6 PM.   diclofenac 75 MG EC tablet Commonly known as:  VOLTAREN Take 75 mg by mouth 2 (two) times daily.   docusate sodium 100 MG capsule Commonly known as:  COLACE Take 1 capsule (100 mg total) by mouth 2 (two) times daily as needed for mild constipation.   escitalopram 10 MG tablet Commonly known as:  LEXAPRO Take 10 mg by mouth at bedtime.   fexofenadine-pseudoephedrine 60-120 MG 12 hr tablet Commonly known as:  ALLEGRA-D Take 1 tablet by mouth daily as needed (allergies).   Fish Oil-Flax Oil-Borage Oil Caps Take 1 tablet by mouth 3 (three) times daily before meals.   GLUCOSAMINE-CHONDROITIN PO Take 1 tablet by mouth 2 (two) times daily.  methocarbamol 500 MG tablet Commonly known as:  ROBAXIN Take 1 tablet (500 mg total) by mouth every 6 (six) hours as needed for muscle spasms.   multivitamin with minerals Tabs tablet Take 1 tablet by mouth daily. Men's 50 +   NASACORT ALLERGY 24HR NA Place 1 spray into the nose daily.   oxyCODONE 5 MG immediate release tablet Commonly known as:  Oxy IR/ROXICODONE Take 1-2 tablets (5-10 mg total) by mouth every 4  (four) hours as needed for severe pain.   polyethylene glycol packet Commonly known as:  MIRALAX / GLYCOLAX Take 17 g by mouth daily.   SUDAFED PO Take 2 tablets by mouth 2 (two) times daily as needed (sinus congestion). Takes only when out of allegra      Follow-up Information    Susa Day, MD Follow up in 2 week(s).   Specialty:  Orthopedic Surgery Contact information: [address] [place] [address] [phone]        [facility] Follow up.   Specialty:  [place]:  phsyical therapy Contact information: [address] [place] [address] [phone]           Signed: Lacie Draft, PA-C Orthopaedic Surgery 06/25/2017, 11:18 AM

## 2017-12-03 ENCOUNTER — Ambulatory Visit: Payer: Self-pay | Admitting: Orthopedic Surgery

## 2017-12-21 ENCOUNTER — Ambulatory Visit: Payer: Self-pay | Admitting: Orthopedic Surgery

## 2017-12-21 NOTE — H&P (Signed)
Corey Clarke is an 62 y.o. male.   Chief Complaint: L knee pain HPI: The patient is a 62 year old male presenting for a post-operative visit. Patient is nearly 4 1/2 months postop following their right total knee arthroplasty.Overall the patient feels that they are doing well. Pain medications include: oxycodone and tizanidine (he takes this for his back and neck) . There have been no post operative complications. Patient has been compliant with post operative instructions. The patient is currently 100 percent weightbearing without the use of assistive devices.  Additional reasons for visit:  Follow-up Knee is described as the following: The patient is being followed for their left knee pain and osteoarthritis. They are now year(s) out from when symptoms began. Symptoms reported today include: pain and aching. The following medication has been used for pain control: oxycodone and tizanidine (he takes this for his back and neck). The patient indicates that they have questions or concerns today regarding surgery.   Past Medical History:  Diagnosis Date  . Arthritis   . Complication of anesthesia   . PONV (postoperative nausea and vomiting)    and abd pain    Past Surgical History:  Procedure Laterality Date  . BACK SURGERY     spinal fusion 3,4,5, 11/24/16 Dr. Wiliam Ke  . CARPAL TUNNEL RELEASE     left hand  . staph infection     Left elbow cleaned out 20 years plus  . TOTAL KNEE ARTHROPLASTY Right 06/21/2017   Procedure: RIGHT TOTAL KNEE ARTHROPLASTY;  Surgeon: Susa Day, MD;  Location: [facility];  Service: Orthopedics;  Laterality: Right;  120 mins    No family history on file. Social History:  reports that  has never smoked. he has never used smokeless tobacco. He reports that he drinks about 0.6 oz of alcohol per week. He reports that he does not use drugs.  Allergies:  Allergies  Allergen Reactions  . Ciprofloxacin Hives  . Other Nausea And Vomiting    12-14-2016  anesthetics during spinal fusion procedure  cause nausea/vomiting     (Not in a hospital admission)  No results found for this or any previous visit (from the past 48 hour(s)). No results found.  Review of Systems  Constitutional: Negative.   HENT: Negative.   Eyes: Negative.   Respiratory: Negative.   Cardiovascular: Negative.   Gastrointestinal: Negative.   Genitourinary: Negative.   Musculoskeletal: Positive for joint pain.  Skin: Negative.   Neurological: Negative.   Psychiatric/Behavioral: Negative.     There were no vitals taken for this visit. Physical Exam  Constitutional: He is oriented to person, place, and time. He appears well-developed and well-nourished.  HENT:  Head: Normocephalic.  Eyes: Pupils are equal, round, and reactive to light.  Neck: Normal range of motion.  Cardiovascular: Normal rate.  Respiratory: Effort normal.  GI: Soft.  Musculoskeletal:  Right knee no palpable tenderness no swelling his range is 0-140. Is stable. Quad is 5/5.  left knee tender medial joint line, patellofemoral pain compression varus deformity walks in antalgic gait  Neurological: He is alert and oriented to person, place, and time.    X-rays left end-stage ice with raise a left knee medial compartment  Right knee well-placed prosthesis.  Assessment/Plan L knee osteoarthritis  Patient is doing well status post right total knee replacement. We will see him in 8 months for an x-ray  For his left proceed in February with a total knee replacement on the left given that he  is bone-on-bone failing conservative treatment  We discussed the risk and benefits on the left as we did on the right. We will proceed accordingly.  Plan left total knee replacement  Cecilie Kicks., PA-C for Dr. Tonita Cong 12/21/2017, 12:24 PM

## 2017-12-21 NOTE — H&P (View-Only) (Signed)
Corey Clarke is an 62 y.o. male.   Chief Complaint: L knee pain HPI: The patient is a 62 year old male presenting for a post-operative visit. Patient is nearly 4 1/2 months postop following their right total knee arthroplasty.Overall the patient feels that they are doing well. Pain medications include: oxycodone and tizanidine (he takes this for his back and neck) . There have been no post operative complications. Patient has been compliant with post operative instructions. The patient is currently 100 percent weightbearing without the use of assistive devices.  Additional reasons for visit:  Follow-up Knee is described as the following: The patient is being followed for their left knee pain and osteoarthritis. They are now year(s) out from when symptoms began. Symptoms reported today include: pain and aching. The following medication has been used for pain control: oxycodone and tizanidine (he takes this for his back and neck). The patient indicates that they have questions or concerns today regarding surgery.   Past Medical History:  Diagnosis Date  . Arthritis   . Complication of anesthesia   . PONV (postoperative nausea and vomiting)    and abd pain    Past Surgical History:  Procedure Laterality Date  . BACK SURGERY     spinal fusion 3,4,5, 11/24/16 Dr. Wiliam Ke  . CARPAL TUNNEL RELEASE     left hand  . staph infection     Left elbow cleaned out 20 years plus  . TOTAL KNEE ARTHROPLASTY Right 06/21/2017   Procedure: RIGHT TOTAL KNEE ARTHROPLASTY;  Surgeon: Susa Day, MD;  Location: [facility];  Service: Orthopedics;  Laterality: Right;  120 mins    No family history on file. Social History:  reports that  has never smoked. he has never used smokeless tobacco. He reports that he drinks about 0.6 oz of alcohol per week. He reports that he does not use drugs.  Allergies:  Allergies  Allergen Reactions  . Ciprofloxacin Hives  . Other Nausea And Vomiting    12-14-2016  anesthetics during spinal fusion procedure  cause nausea/vomiting     (Not in a hospital admission)  No results found for this or any previous visit (from the past 48 hour(s)). No results found.  Review of Systems  Constitutional: Negative.   HENT: Negative.   Eyes: Negative.   Respiratory: Negative.   Cardiovascular: Negative.   Gastrointestinal: Negative.   Genitourinary: Negative.   Musculoskeletal: Positive for joint pain.  Skin: Negative.   Neurological: Negative.   Psychiatric/Behavioral: Negative.     There were no vitals taken for this visit. Physical Exam  Constitutional: He is oriented to person, place, and time. He appears well-developed and well-nourished.  HENT:  Head: Normocephalic.  Eyes: Pupils are equal, round, and reactive to light.  Neck: Normal range of motion.  Cardiovascular: Normal rate.  Respiratory: Effort normal.  GI: Soft.  Musculoskeletal:  Right knee no palpable tenderness no swelling his range is 0-140. Is stable. Quad is 5/5.  left knee tender medial joint line, patellofemoral pain compression varus deformity walks in antalgic gait  Neurological: He is alert and oriented to person, place, and time.    X-rays left end-stage ice with raise a left knee medial compartment  Right knee well-placed prosthesis.  Assessment/Plan L knee osteoarthritis  Patient is doing well status post right total knee replacement. We will see him in 8 months for an x-ray  For his left proceed in February with a total knee replacement on the left given that he  is bone-on-bone failing conservative treatment  We discussed the risk and benefits on the left as we did on the right. We will proceed accordingly.  Plan left total knee replacement  Cecilie Kicks., PA-C for Dr. Tonita Cong 12/21/2017, 12:24 PM

## 2018-01-03 ENCOUNTER — Other Ambulatory Visit (HOSPITAL_COMMUNITY): Payer: Self-pay | Admitting: Emergency Medicine

## 2018-01-03 NOTE — Patient Instructions (Signed)
COLESTON DIROSA  01/03/2018   Your procedure is scheduled on: 01-10-18   Report to [facility] Main  Entrance   Follow signs to Short Stay on first floor at 530 AM    Call this number if you have problems the morning of surgery [phone]     Remember: Do not eat food or drink liquids :After Midnight.     Take these medicines the morning of surgery with A SIP OF WATER: tylenol, allegra, and nasal spray if needed                                You may not have any metal on your body including hair pins and              piercings  Do not wear jewelry, make-up, lotions, powders or perfumes, deodorant              Men may shave face and neck.   Do not bring valuables to the hospital. [place].  Contacts, dentures or bridgework may not be worn into surgery.  Leave suitcase in the car. After surgery it may be brought to your room.                Please read over the following fact sheets you were given: _____________________________________________________________________             [facility] - Preparing for Surgery Before surgery, you can play an important role.  Because skin is not sterile, your skin needs to be as free of germs as possible.  You can reduce the number of germs on your skin by washing with CHG (chlorahexidine gluconate) soap before surgery.  CHG is an antiseptic cleaner which kills germs and bonds with the skin to continue killing germs even after washing. Please DO NOT use if you have an allergy to CHG or antibacterial soaps.  If your skin becomes reddened/irritated stop using the CHG and inform your nurse when you arrive at Short Stay. Do not shave (including legs and underarms) for at least 48 hours prior to the first CHG shower.  You may shave your face/neck. Please follow these instructions carefully:  1.  Shower with CHG Soap the night before surgery and the  morning of  Surgery.  2.  If you choose to wash your hair, wash your hair first as usual with your  normal  shampoo.  3.  After you shampoo, rinse your hair and body thoroughly to remove the  shampoo.                           4.  Use CHG as you would any other liquid soap.  You can apply chg directly  to the skin and wash                       Gently with a scrungie or clean washcloth.  5.  Apply the CHG Soap to your body ONLY FROM THE NECK DOWN.   Do not use on face/ open  Wound or open sores. Avoid contact with eyes, ears mouth and genitals (private parts).                       Wash face,  Genitals (private parts) with your normal soap.             6.  Wash thoroughly, paying special attention to the area where your surgery  will be performed.  7.  Thoroughly rinse your body with warm water from the neck down.  8.  DO NOT shower/wash with your normal soap after using and rinsing off  the CHG Soap.                9.  Pat yourself dry with a clean towel.            10.  Wear clean pajamas.            11.  Place clean sheets on your bed the night of your first shower and do not  sleep with pets. Day of Surgery : Do not apply any lotions/deodorants the morning of surgery.  Please wear clean clothes to the hospital/surgery center.  FAILURE TO FOLLOW THESE INSTRUCTIONS MAY RESULT IN THE CANCELLATION OF YOUR SURGERY PATIENT SIGNATURE_________________________________  NURSE SIGNATURE__________________________________  ________________________________________________________________________   [name]  An incentive spirometer is a tool that can help keep your lungs clear and active. This tool measures how well you are filling your lungs with each breath. Taking long deep breaths may help reverse or decrease the chance of developing breathing (pulmonary) problems (especially infection) following:  A long period of time when you are unable to move or be active. BEFORE  THE PROCEDURE   If the spirometer includes an indicator to show your best effort, your nurse or respiratory therapist will set it to a desired goal.  If possible, sit up straight or lean slightly forward. Try not to slouch.  Hold the incentive spirometer in an upright position. INSTRUCTIONS FOR USE  1. Sit on the edge of your bed if possible, or sit up as far as you can in bed or on a chair. 2. Hold the incentive spirometer in an upright position. 3. Breathe out normally. 4. Place the mouthpiece in your mouth and seal your lips tightly around it. 5. Breathe in slowly and as deeply as possible, raising the piston or the ball toward the top of the column. 6. Hold your breath for 3-5 seconds or for as long as possible. Allow the piston or ball to fall to the bottom of the column. 7. Remove the mouthpiece from your mouth and breathe out normally. 8. Rest for a few seconds and repeat Steps 1 through 7 at least 10 times every 1-2 hours when you are awake. Take your time and take a few normal breaths between deep breaths. 9. The spirometer may include an indicator to show your best effort. Use the indicator as a goal to work toward during each repetition. 10. After each set of 10 deep breaths, practice coughing to be sure your lungs are clear. If you have an incision (the cut made at the time of surgery), support your incision when coughing by placing a pillow or rolled up towels firmly against it. Once you are able to get out of bed, walk around indoors and cough well. You may stop using the incentive spirometer when instructed by your caregiver.  RISKS AND COMPLICATIONS  Take your time so you do not get  dizzy or light-headed.  If you are in pain, you may need to take or ask for pain medication before doing incentive spirometry. It is harder to take a deep breath if you are having pain. AFTER USE  Rest and breathe slowly and easily.  It can be helpful to keep track of a log of your progress.  Your caregiver can provide you with a simple table to help with this. If you are using the spirometer at home, follow these instructions: [place] IF:   You are having difficultly using the spirometer.  You have trouble using the spirometer as often as instructed.  Your pain medication is not giving enough relief while using the spirometer.  You develop fever of 100.5 F (38.1 C) or higher. SEEK IMMEDIATE MEDICAL CARE IF:   You cough up bloody sputum that had not been present before.  You develop fever of 102 F (38.9 C) or greater.  You develop worsening pain at or near the incision site. MAKE SURE YOU:   Understand these instructions.  Will watch your condition.  Will get help right away if you are not doing well or get worse. Document Released: 03/19/2007 Document Revised: 01/29/2012 Document Reviewed: 05/20/2007 [facility] Patient Information 2014 [place], [place].   ________________________________________________________________________

## 2018-01-04 ENCOUNTER — Encounter (HOSPITAL_COMMUNITY)
Admission: RE | Admit: 2018-01-04 | Discharge: 2018-01-04 | Disposition: A | Discharge status: Home / Self Care | Payer: 59 | Source: Ambulatory Visit | Attending: Specialist | Admitting: Specialist

## 2018-01-04 ENCOUNTER — Encounter (HOSPITAL_COMMUNITY): Payer: Self-pay

## 2018-01-04 ENCOUNTER — Other Ambulatory Visit: Payer: Self-pay

## 2018-01-04 DIAGNOSIS — Z01812 Encounter for preprocedural laboratory examination: Secondary | ICD-10-CM | POA: Insufficient documentation

## 2018-01-04 DIAGNOSIS — M1712 Unilateral primary osteoarthritis, left knee: Secondary | ICD-10-CM | POA: Insufficient documentation

## 2018-01-04 HISTORY — DX: Pure hypercholesterolemia, unspecified: E78.00

## 2018-01-04 LAB — CBC
HEMATOCRIT: 43.2 % (ref 39.0–52.0)
Hemoglobin: 14.9 g/dL (ref 13.0–17.0)
MCH: 32.1 pg (ref 26.0–34.0)
MCHC: 34.5 g/dL (ref 30.0–36.0)
MCV: 93.1 fL (ref 78.0–100.0)
PLATELETS: 231 10*3/uL (ref 150–400)
RBC: 4.64 MIL/uL (ref 4.22–5.81)
RDW: 13.1 % (ref 11.5–15.5)
WBC: 6.3 10*3/uL (ref 4.0–10.5)

## 2018-01-04 LAB — URINALYSIS, ROUTINE W REFLEX MICROSCOPIC
Bacteria, UA: NONE SEEN
Bilirubin Urine: NEGATIVE
GLUCOSE, UA: NEGATIVE mg/dL
Ketones, ur: NEGATIVE mg/dL
Leukocytes, UA: NEGATIVE
NITRITE: NEGATIVE
PH: 5 (ref 5.0–8.0)
PROTEIN: NEGATIVE mg/dL
RBC / HPF: NONE SEEN RBC/hpf (ref 0–5)
Specific Gravity, Urine: 1.015 (ref 1.005–1.030)
Squamous Epithelial / LPF: NONE SEEN
WBC UA: NONE SEEN WBC/hpf (ref 0–5)

## 2018-01-04 LAB — SURGICAL PCR SCREEN
MRSA, PCR: NEGATIVE
Staphylococcus aureus: NEGATIVE

## 2018-01-04 LAB — BASIC METABOLIC PANEL
Anion gap: 12 (ref 5–15)
BUN: 17 mg/dL (ref 6–20)
CHLORIDE: 103 mmol/L (ref 101–111)
CO2: 24 mmol/L (ref 22–32)
CREATININE: 0.7 mg/dL (ref 0.61–1.24)
Calcium: 9.3 mg/dL (ref 8.9–10.3)
GFR calc non Af Amer: >60 mL/min (ref >60)
Glucose, Bld: 83 mg/dL (ref 65–99)
POTASSIUM: 4 mmol/L (ref 3.5–5.1)
SODIUM: 139 mmol/L (ref 135–145)

## 2018-01-04 LAB — PROTIME-INR
INR: 0.91
Prothrombin Time: 12.1 seconds (ref 11.4–15.2)

## 2018-01-04 LAB — APTT: aPTT: 26 seconds (ref 24–36)

## 2018-01-04 NOTE — Progress Notes (Signed)
URINALYSIS ROUTED VIA Epic TO DR JEFFREY Tonita Cong

## 2018-01-09 MED ORDER — TRANEXAMIC ACID 1000 MG/10ML IV SOLN
1000.0000 mg | INTRAVENOUS | Status: AC
  Administered 2018-01-10: 1000 mg via INTRAVENOUS
  Filled 2018-01-09: qty 1100

## 2018-01-09 NOTE — Anesthesia Preprocedure Evaluation (Addendum)
Anesthesia Evaluation  Patient identified by MRN, date of birth, ID band Patient awake    Reviewed: Allergy & Precautions, H&P , NPO status , Patient's Chart, lab work & pertinent test results  History of Anesthesia Complications (+) PONV  Airway Mallampati: II  TM Distance: >3 FB Neck ROM: Full    Dental no notable dental hx. (+) Teeth Intact, Dental Advisory Given   Pulmonary neg pulmonary ROS,    Pulmonary exam normal breath sounds clear to auscultation       Cardiovascular Exercise Tolerance: Good negative cardio ROS   Rhythm:Regular Rate:Normal     Neuro/Psych negative neurological ROS  negative psych ROS   GI/Hepatic negative GI ROS, Neg liver ROS,   Endo/Other  negative endocrine ROS  Renal/GU negative Renal ROS  negative genitourinary   Musculoskeletal  (+) Arthritis , Osteoarthritis,    Abdominal   Peds  Hematology negative hematology ROS (+)   Anesthesia Other Findings   Reproductive/Obstetrics negative OB ROS                            Anesthesia Physical Anesthesia Plan  ASA: II  Anesthesia Plan: Spinal   Post-op Pain Management:  Regional for Post-op pain   Induction: Intravenous  PONV Risk Score and Plan: 3 and Ondansetron, Dexamethasone, Propofol infusion and Midazolam  Airway Management Planned: Simple Face Mask  Additional Equipment:   Intra-op Plan:   Post-operative Plan:   Informed Consent: I have reviewed the patients History and Physical, chart, labs and discussed the procedure including the risks, benefits and alternatives for the proposed anesthesia with the patient or authorized representative who has indicated his/her understanding and acceptance.   Dental advisory given  Plan Discussed with: CRNA  Anesthesia Plan Comments:         Anesthesia Quick Evaluation

## 2018-01-10 ENCOUNTER — Encounter (HOSPITAL_COMMUNITY): Payer: Self-pay | Admitting: Emergency Medicine

## 2018-01-10 ENCOUNTER — Other Ambulatory Visit: Payer: Self-pay

## 2018-01-10 ENCOUNTER — Inpatient Hospital Stay (HOSPITAL_COMMUNITY): Payer: 59 | Admitting: Anesthesiology

## 2018-01-10 ENCOUNTER — Inpatient Hospital Stay (HOSPITAL_COMMUNITY): Payer: 59

## 2018-01-10 ENCOUNTER — Encounter (HOSPITAL_COMMUNITY)
Admission: RE | Disposition: A | Discharge status: Home Health | Payer: Self-pay | Source: Ambulatory Visit | Attending: Specialist

## 2018-01-10 ENCOUNTER — Inpatient Hospital Stay (HOSPITAL_COMMUNITY)
Admission: RE | Admit: 2018-01-10 | Discharge: 2018-01-12 | DRG: 470 | Disposition: A | Discharge status: Home Health | Payer: 59 | Source: Ambulatory Visit | Attending: Specialist | Admitting: Specialist

## 2018-01-10 DIAGNOSIS — Z96651 Presence of right artificial knee joint: Secondary | ICD-10-CM | POA: Diagnosis present

## 2018-01-10 DIAGNOSIS — E7800 Pure hypercholesterolemia, unspecified: Secondary | ICD-10-CM | POA: Diagnosis present

## 2018-01-10 DIAGNOSIS — M1712 Unilateral primary osteoarthritis, left knee: Principal | ICD-10-CM | POA: Diagnosis present

## 2018-01-10 DIAGNOSIS — Z881 Allergy status to other antibiotic agents status: Secondary | ICD-10-CM | POA: Diagnosis not present

## 2018-01-10 DIAGNOSIS — M25562 Pain in left knee: Secondary | ICD-10-CM | POA: Diagnosis present

## 2018-01-10 DIAGNOSIS — Z884 Allergy status to anesthetic agent status: Secondary | ICD-10-CM | POA: Diagnosis not present

## 2018-01-10 DIAGNOSIS — Z96659 Presence of unspecified artificial knee joint: Secondary | ICD-10-CM

## 2018-01-10 HISTORY — PX: TOTAL KNEE ARTHROPLASTY: SHX125

## 2018-01-10 SURGERY — ARTHROPLASTY, KNEE, TOTAL
Anesthesia: Spinal | Site: Knee | Laterality: Left

## 2018-01-10 MED ORDER — CEFAZOLIN SODIUM-DEXTROSE 2-4 GM/100ML-% IV SOLN
2.0000 g | Freq: Four times a day (QID) | INTRAVENOUS | Status: AC
  Administered 2018-01-10 – 2018-01-11 (×3): 2 g via INTRAVENOUS
  Filled 2018-01-10 (×3): qty 100

## 2018-01-10 MED ORDER — OXYCODONE-ACETAMINOPHEN 5-325 MG PO TABS: 1.0000 | ORAL_TABLET | ORAL | 0 refills | Status: DC | PRN

## 2018-01-10 MED ORDER — PROPOFOL 500 MG/50ML IV EMUL
INTRAVENOUS | Status: DC | PRN
  Administered 2018-01-10: 75 ug/kg/min via INTRAVENOUS

## 2018-01-10 MED ORDER — EPHEDRINE 5 MG/ML INJ
INTRAVENOUS | Status: AC
  Filled 2018-01-10: qty 10

## 2018-01-10 MED ORDER — BISACODYL 5 MG PO TBEC: 5.0000 mg | DELAYED_RELEASE_TABLET | Freq: Every day | ORAL | Status: DC | PRN

## 2018-01-10 MED ORDER — ESCITALOPRAM OXALATE 10 MG PO TABS
10.0000 mg | ORAL_TABLET | Freq: Every day | ORAL | Status: DC
  Administered 2018-01-10 – 2018-01-11 (×2): 10 mg via ORAL
  Filled 2018-01-10 (×2): qty 1

## 2018-01-10 MED ORDER — OXYCODONE HCL 5 MG PO TABS: 5.0000 mg | ORAL_TABLET | ORAL | Status: DC | PRN

## 2018-01-10 MED ORDER — ASPIRIN EC 325 MG PO TBEC: 325.0000 mg | DELAYED_RELEASE_TABLET | Freq: Two times a day (BID) | ORAL | 1 refills | Status: DC

## 2018-01-10 MED ORDER — MENTHOL 3 MG MT LOZG: 1.0000 | LOZENGE | OROMUCOSAL | Status: DC | PRN

## 2018-01-10 MED ORDER — ACETAMINOPHEN 10 MG/ML IV SOLN
1000.0000 mg | INTRAVENOUS | Status: AC
  Administered 2018-01-10: 1000 mg via INTRAVENOUS
  Filled 2018-01-10: qty 100

## 2018-01-10 MED ORDER — SODIUM CHLORIDE 0.9 % IV SOLN
INTRAVENOUS | Status: AC
  Filled 2018-01-10: qty 500000

## 2018-01-10 MED ORDER — PROPOFOL 500 MG/50ML IV EMUL
INTRAVENOUS | Status: DC | PRN
  Administered 2018-01-10: 20 mg via INTRAVENOUS
  Administered 2018-01-10: 10 mg via INTRAVENOUS

## 2018-01-10 MED ORDER — METOCLOPRAMIDE HCL 5 MG PO TABS: 5.0000 mg | ORAL_TABLET | Freq: Three times a day (TID) | ORAL | Status: DC | PRN

## 2018-01-10 MED ORDER — KCL IN DEXTROSE-NACL 20-5-0.45 MEQ/L-%-% IV SOLN
INTRAVENOUS | Status: AC
  Administered 2018-01-10: 13:00:00 via INTRAVENOUS
  Filled 2018-01-10 (×2): qty 1000

## 2018-01-10 MED ORDER — DOCUSATE SODIUM 100 MG PO CAPS
100.0000 mg | ORAL_CAPSULE | Freq: Two times a day (BID) | ORAL | Status: DC
  Administered 2018-01-10 – 2018-01-12 (×4): 100 mg via ORAL
  Filled 2018-01-10 (×4): qty 1

## 2018-01-10 MED ORDER — ONDANSETRON HCL 4 MG/2ML IJ SOLN
INTRAMUSCULAR | Status: AC
  Filled 2018-01-10: qty 2

## 2018-01-10 MED ORDER — MAGNESIUM CITRATE PO SOLN: 1.0000 | Freq: Once | ORAL | Status: DC | PRN

## 2018-01-10 MED ORDER — THROMBIN 5000 UNITS EX SOLR
CUTANEOUS | Status: DC | PRN
  Administered 2018-01-10: 10000 [IU] via TOPICAL

## 2018-01-10 MED ORDER — ACETAMINOPHEN 325 MG PO TABS
650.0000 mg | ORAL_TABLET | ORAL | Status: DC | PRN
  Administered 2018-01-10 – 2018-01-12 (×3): 650 mg via ORAL
  Filled 2018-01-10 (×3): qty 2

## 2018-01-10 MED ORDER — OXYCODONE HCL 5 MG PO TABS
10.0000 mg | ORAL_TABLET | ORAL | Status: DC | PRN
  Administered 2018-01-10 – 2018-01-12 (×11): 10 mg via ORAL
  Filled 2018-01-10 (×13): qty 2

## 2018-01-10 MED ORDER — HYDROMORPHONE HCL 1 MG/ML IJ SOLN: 0.2500 mg | INTRAMUSCULAR | Status: DC | PRN

## 2018-01-10 MED ORDER — PROPOFOL 10 MG/ML IV BOLUS
INTRAVENOUS | Status: AC
  Filled 2018-01-10: qty 20

## 2018-01-10 MED ORDER — SODIUM CHLORIDE 0.9 % IR SOLN
Status: DC | PRN
  Administered 2018-01-10: 2000 mL
  Administered 2018-01-10: 1000 mL

## 2018-01-10 MED ORDER — METHOCARBAMOL 500 MG PO TABS: 500.0000 mg | ORAL_TABLET | Freq: Four times a day (QID) | ORAL | 1 refills | Status: DC | PRN

## 2018-01-10 MED ORDER — RISAQUAD PO CAPS
1.0000 | ORAL_CAPSULE | Freq: Every day | ORAL | Status: DC
  Administered 2018-01-11 – 2018-01-12 (×2): 1 via ORAL
  Filled 2018-01-10 (×2): qty 1

## 2018-01-10 MED ORDER — ALUM & MAG HYDROXIDE-SIMETH 200-200-20 MG/5ML PO SUSP: 30.0000 mL | ORAL | Status: DC | PRN

## 2018-01-10 MED ORDER — SODIUM CHLORIDE 0.9 % IV SOLN
INTRAVENOUS | Status: DC | PRN
  Administered 2018-01-10: 500 mL

## 2018-01-10 MED ORDER — ASPIRIN EC 325 MG PO TBEC
325.0000 mg | DELAYED_RELEASE_TABLET | Freq: Two times a day (BID) | ORAL | Status: DC
  Administered 2018-01-10 – 2018-01-12 (×4): 325 mg via ORAL
  Filled 2018-01-10 (×4): qty 1

## 2018-01-10 MED ORDER — ONDANSETRON HCL 4 MG PO TABS: 4.0000 mg | ORAL_TABLET | Freq: Four times a day (QID) | ORAL | Status: DC | PRN

## 2018-01-10 MED ORDER — POLYETHYLENE GLYCOL 3350 17 G PO PACK: 17.0000 g | PACK | Freq: Every day | ORAL | 0 refills | Status: DC

## 2018-01-10 MED ORDER — DIPHENHYDRAMINE HCL 12.5 MG/5ML PO ELIX
12.5000 mg | ORAL_SOLUTION | ORAL | Status: DC | PRN
  Administered 2018-01-11: 22:00:00 25 mg via ORAL
  Filled 2018-01-10: qty 10

## 2018-01-10 MED ORDER — BUPIVACAINE-EPINEPHRINE 0.25% -1:200000 IJ SOLN
INTRAMUSCULAR | Status: AC
  Filled 2018-01-10: qty 1

## 2018-01-10 MED ORDER — FENTANYL CITRATE (PF) 100 MCG/2ML IJ SOLN
INTRAMUSCULAR | Status: AC
  Filled 2018-01-10: qty 2

## 2018-01-10 MED ORDER — DOCUSATE SODIUM 100 MG PO CAPS: 100.0000 mg | ORAL_CAPSULE | Freq: Two times a day (BID) | ORAL | 1 refills | Status: DC | PRN

## 2018-01-10 MED ORDER — ACETAMINOPHEN 10 MG/ML IV SOLN
1000.0000 mg | Freq: Four times a day (QID) | INTRAVENOUS | Status: AC
  Administered 2018-01-10 – 2018-01-11 (×2): 1000 mg via INTRAVENOUS
  Filled 2018-01-10 (×4): qty 100

## 2018-01-10 MED ORDER — POLYETHYLENE GLYCOL 3350 17 G PO PACK: 17.0000 g | PACK | Freq: Every day | ORAL | Status: DC | PRN

## 2018-01-10 MED ORDER — DEXAMETHASONE SODIUM PHOSPHATE 10 MG/ML IJ SOLN
INTRAMUSCULAR | Status: DC | PRN
  Administered 2018-01-10: 10 mg via INTRAVENOUS

## 2018-01-10 MED ORDER — ONDANSETRON HCL 4 MG/2ML IJ SOLN: 4.0000 mg | Freq: Four times a day (QID) | INTRAMUSCULAR | Status: DC | PRN

## 2018-01-10 MED ORDER — THROMBIN (RECOMBINANT) 5000 UNITS EX SOLR
CUTANEOUS | Status: AC
  Filled 2018-01-10: qty 10000

## 2018-01-10 MED ORDER — METHOCARBAMOL 500 MG PO TABS
500.0000 mg | ORAL_TABLET | Freq: Four times a day (QID) | ORAL | Status: DC | PRN
  Administered 2018-01-10 – 2018-01-12 (×4): 500 mg via ORAL
  Filled 2018-01-10 (×4): qty 1

## 2018-01-10 MED ORDER — ACETAMINOPHEN 650 MG RE SUPP: 650.0000 mg | RECTAL | Status: DC | PRN

## 2018-01-10 MED ORDER — KETOROLAC TROMETHAMINE 15 MG/ML IJ SOLN
15.0000 mg | Freq: Four times a day (QID) | INTRAMUSCULAR | Status: AC
  Administered 2018-01-10 – 2018-01-11 (×4): 15 mg via INTRAVENOUS
  Filled 2018-01-10 (×4): qty 1

## 2018-01-10 MED ORDER — HYDROMORPHONE HCL 1 MG/ML IJ SOLN
1.0000 mg | INTRAMUSCULAR | Status: DC | PRN
  Administered 2018-01-10 – 2018-01-12 (×6): 1 mg via INTRAVENOUS
  Filled 2018-01-10 (×6): qty 1

## 2018-01-10 MED ORDER — METHOCARBAMOL 1000 MG/10ML IJ SOLN
500.0000 mg | Freq: Four times a day (QID) | INTRAVENOUS | Status: DC | PRN
  Filled 2018-01-10: qty 5

## 2018-01-10 MED ORDER — PHENYLEPHRINE 40 MCG/ML (10ML) SYRINGE FOR IV PUSH (FOR BLOOD PRESSURE SUPPORT)
PREFILLED_SYRINGE | INTRAVENOUS | Status: AC
  Filled 2018-01-10: qty 10

## 2018-01-10 MED ORDER — PHENOL 1.4 % MT LIQD: 1.0000 | OROMUCOSAL | Status: DC | PRN

## 2018-01-10 MED ORDER — BUPIVACAINE-EPINEPHRINE 0.25% -1:200000 IJ SOLN
INTRAMUSCULAR | Status: DC | PRN
  Administered 2018-01-10: 10 mL
  Administered 2018-01-10: 40 mL

## 2018-01-10 MED ORDER — BUPIVACAINE IN DEXTROSE 0.75-8.25 % IT SOLN
INTRATHECAL | Status: DC | PRN
  Administered 2018-01-10: 15 mg via INTRATHECAL

## 2018-01-10 MED ORDER — LACTATED RINGERS IV SOLN
INTRAVENOUS | Status: DC
  Administered 2018-01-10 (×2): via INTRAVENOUS

## 2018-01-10 MED ORDER — ROPIVACAINE HCL 7.5 MG/ML IJ SOLN
INTRAMUSCULAR | Status: DC | PRN
  Administered 2018-01-10: 20 mL via PERINEURAL

## 2018-01-10 MED ORDER — MIDAZOLAM HCL 5 MG/5ML IJ SOLN
INTRAMUSCULAR | Status: DC | PRN
  Administered 2018-01-10: 2 mg via INTRAVENOUS

## 2018-01-10 MED ORDER — ONDANSETRON HCL 4 MG/2ML IJ SOLN
INTRAMUSCULAR | Status: DC | PRN
  Administered 2018-01-10: 4 mg via INTRAVENOUS

## 2018-01-10 MED ORDER — CEFAZOLIN SODIUM-DEXTROSE 2-4 GM/100ML-% IV SOLN
2.0000 g | INTRAVENOUS | Status: AC
  Administered 2018-01-10: 2 g via INTRAVENOUS
  Filled 2018-01-10: qty 100

## 2018-01-10 MED ORDER — METOCLOPRAMIDE HCL 5 MG/ML IJ SOLN: 5.0000 mg | Freq: Three times a day (TID) | INTRAMUSCULAR | Status: DC | PRN

## 2018-01-10 MED ORDER — PROPOFOL 10 MG/ML IV BOLUS
INTRAVENOUS | Status: AC
Start: 2018-01-10 — End: 2018-01-10
  Filled 2018-01-10: qty 20

## 2018-01-10 MED ORDER — MIDAZOLAM HCL 2 MG/2ML IJ SOLN
INTRAMUSCULAR | Status: AC
  Filled 2018-01-10: qty 2

## 2018-01-10 MED ORDER — STERILE WATER FOR IRRIGATION IR SOLN
Status: DC | PRN
  Administered 2018-01-10: 2000 mL

## 2018-01-10 MED ORDER — FENTANYL CITRATE (PF) 100 MCG/2ML IJ SOLN
INTRAMUSCULAR | Status: DC | PRN
  Administered 2018-01-10 (×2): 50 ug via INTRAVENOUS

## 2018-01-10 MED ORDER — PROPOFOL 10 MG/ML IV BOLUS
INTRAVENOUS | Status: AC
  Filled 2018-01-10: qty 40

## 2018-01-10 MED ORDER — GABAPENTIN 300 MG PO CAPS
300.0000 mg | ORAL_CAPSULE | Freq: Three times a day (TID) | ORAL | Status: DC
  Administered 2018-01-10 – 2018-01-12 (×6): 300 mg via ORAL
  Filled 2018-01-10 (×6): qty 1

## 2018-01-10 SURGICAL SUPPLY — 68 items
AGENT HMST SPONGE THK3/8 (HEMOSTASIS) ×1
BAG DECANTER FOR FLEXI CONT (MISCELLANEOUS) ×3 IMPLANT
BAG SPEC THK2 15X12 ZIP CLS (MISCELLANEOUS)
BAG ZIPLOCK 12X15 (MISCELLANEOUS) IMPLANT
BANDAGE ACE 4X5 VEL STRL LF (GAUZE/BANDAGES/DRESSINGS) ×3 IMPLANT
BANDAGE ACE 6X5 VEL STRL LF (GAUZE/BANDAGES/DRESSINGS) ×3 IMPLANT
BLADE SAG 18X100X1.27 (BLADE) ×3 IMPLANT
BLADE SAW SGTL 11.0X1.19X90.0M (BLADE) ×3 IMPLANT
BLADE SAW SGTL 13.0X1.19X90.0M (BLADE) ×3 IMPLANT
CAPT KNEE TOTAL 3 ATTUNE ×3 IMPLANT
CEMENT HV SMART SET (Cement) ×6 IMPLANT
CLOSURE WOUND 1/2 X4 (GAUZE/BANDAGES/DRESSINGS)
CLOTH 2% CHLOROHEXIDINE 3PK (PERSONAL CARE ITEMS) ×3 IMPLANT
COVER SURGICAL LIGHT HANDLE (MISCELLANEOUS) ×3 IMPLANT
CUFF TOURN SGL QUICK 34 (TOURNIQUET CUFF) ×3
CUFF TRNQT CYL 34X4X40X1 (TOURNIQUET CUFF) ×1 IMPLANT
DECANTER SPIKE VIAL GLASS SM (MISCELLANEOUS) ×3 IMPLANT
DRAPE INCISE IOBAN 66X45 STRL (DRAPES) IMPLANT
DRAPE ORTHO SPLIT 77X108 STRL (DRAPES) ×6
DRAPE SHEET LG 3/4 BI-LAMINATE (DRAPES) ×12 IMPLANT
DRAPE SURG ORHT 6 SPLT 77X108 (DRAPES) ×2 IMPLANT
DRAPE U-SHAPE 47X51 STRL (DRAPES) ×3 IMPLANT
DRSG AQUACEL AG ADV 3.5X10 (GAUZE/BANDAGES/DRESSINGS) ×3 IMPLANT
DRSG TEGADERM 4X4.75 (GAUZE/BANDAGES/DRESSINGS) IMPLANT
DURAPREP 26ML APPLICATOR (WOUND CARE) ×3 IMPLANT
ELECT BLADE TIP CTD 4 INCH (ELECTRODE) ×3 IMPLANT
ELECT REM PT RETURN 15FT ADLT (MISCELLANEOUS) ×3 IMPLANT
EVACUATOR 1/8 PVC DRAIN (DRAIN) IMPLANT
GAUZE SPONGE 2X2 8PLY STRL LF (GAUZE/BANDAGES/DRESSINGS) IMPLANT
GLOVE BIOGEL PI IND STRL 7.0 (GLOVE) ×1 IMPLANT
GLOVE BIOGEL PI IND STRL 8 (GLOVE) ×1 IMPLANT
GLOVE BIOGEL PI INDICATOR 7.0 (GLOVE) ×2
GLOVE BIOGEL PI INDICATOR 8 (GLOVE) ×2
GLOVE SURG SS PI 7.0 STRL IVOR (GLOVE) ×3 IMPLANT
GLOVE SURG SS PI 7.5 STRL IVOR (GLOVE) ×6 IMPLANT
GLOVE SURG SS PI 8.0 STRL IVOR (GLOVE) ×6 IMPLANT
GOWN SPEC L3 XXLG W/TWL (GOWN DISPOSABLE) ×3 IMPLANT
GOWN STRL REUS W/TWL LRG LVL3 (GOWN DISPOSABLE) ×3 IMPLANT
GOWN STRL REUS W/TWL XL LVL3 (GOWN DISPOSABLE) ×6 IMPLANT
HANDPIECE INTERPULSE COAX TIP (DISPOSABLE) ×3
HEMOSTAT SPONGE AVITENE ULTRA (HEMOSTASIS) ×3 IMPLANT
IMMOBILIZER KNEE 20 (SOFTGOODS) ×3
IMMOBILIZER KNEE 20 THIGH 36 (SOFTGOODS) ×1 IMPLANT
MANIFOLD NEPTUNE II (INSTRUMENTS) ×3 IMPLANT
PACK TOTAL KNEE CUSTOM (KITS) ×3 IMPLANT
POSITIONER SURGICAL ARM (MISCELLANEOUS) ×3 IMPLANT
SEALER BIPOLAR AQUA 6.0 (INSTRUMENTS) ×3 IMPLANT
SET HNDPC FAN SPRY TIP SCT (DISPOSABLE) ×1 IMPLANT
SPONGE GAUZE 2X2 STER 10/PKG (GAUZE/BANDAGES/DRESSINGS)
SPONGE LAP 18X18 5 PK (GAUZE/BANDAGES/DRESSINGS) ×6 IMPLANT
SPONGE SURGIFOAM ABS GEL 100 (HEMOSTASIS) ×3 IMPLANT
STAPLER VISISTAT (STAPLE) IMPLANT
STRIP CLOSURE SKIN 1/2X4 (GAUZE/BANDAGES/DRESSINGS) IMPLANT
SUCTION FRAZIER HANDLE 12FR (TUBING) ×2
SUCTION TUBE FRAZIER 12FR DISP (TUBING) ×1 IMPLANT
SUT BONE WAX W31G (SUTURE) ×3 IMPLANT
SUT MNCRL AB 4-0 PS2 18 (SUTURE) IMPLANT
SUT STRATAFIX 0 PDS 27 VIOLET (SUTURE) ×3
SUT VIC AB 1 CT1 27 (SUTURE) ×6
SUT VIC AB 1 CT1 27XBRD ANTBC (SUTURE) ×2 IMPLANT
SUT VIC AB 2-0 CT1 27 (SUTURE) ×9
SUT VIC AB 2-0 CT1 TAPERPNT 27 (SUTURE) ×3 IMPLANT
SUTURE STRATFX 0 PDS 27 VIOLET (SUTURE) ×1 IMPLANT
SYR 50ML LL SCALE MARK (SYRINGE) IMPLANT
TOWER CARTRIDGE SMART MIX (DISPOSABLE) ×3 IMPLANT
TRAY FOLEY W/METER SILVER 16FR (SET/KITS/TRAYS/PACK) ×3 IMPLANT
WRAP KNEE MAXI GEL POST OP (GAUZE/BANDAGES/DRESSINGS) ×3 IMPLANT
YANKAUER SUCT BULB TIP 10FT TU (MISCELLANEOUS) ×3 IMPLANT

## 2018-01-10 NOTE — Anesthesia Procedure Notes (Signed)
Anesthesia Regional Block: Adductor canal block   Pre-Anesthetic Checklist: ,, timeout performed, Correct Patient, Correct Site, Correct Laterality, Correct Procedure, Correct Position, site marked, Risks and benefits discussed, pre-op evaluation,  At surgeon's request and post-op pain management  Laterality: Left  Prep: Maximum Sterile Barrier Precautions used, chloraprep       Needles:  Injection technique: Single-shot  Needle Type: Echogenic Stimulator Needle     Needle Length: 9cm  Needle Gauge: 21     Additional Needles:   Procedures:,,,, ultrasound used (permanent image in chart),,,,  Narrative:  Start time: 01/10/2018 6:50 AM End time: 01/10/2018 7:00 AM Injection made incrementally with aspirations every 5 mL.  Performed by: Personally  Anesthesiologist: Roderic Palau, MD  Additional Notes: 2% Lidocaine skin wheel.

## 2018-01-10 NOTE — Evaluation (Signed)
Physical Therapy Evaluation Patient Details Name: Corey Clarke MRN: [id number] DOB: [birth year] Today's Date: 01/10/2018   History of Present Illness  62 yo male s/p L TKA. 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 He walked ~30 feet with a RW. Pain rated 7/10 with activity. Will follow and progress activity as tolerated.     Follow Up Recommendations Follow surgeon's recommendation for DC plan and follow-up therapies    Equipment Recommendations  None recommended by PT    Recommendations for Other Services       Precautions / Restrictions Precautions Precautions: Fall;Knee Required Braces or Orthoses: Knee Immobilizer - Left Knee Immobilizer - Left: Discontinue once straight leg raise with < 10 degree lag Restrictions Weight Bearing Restrictions: No LLE Weight Bearing: Weight bearing as tolerated      Mobility  Bed Mobility Overal bed mobility: Needs Assistance Bed Mobility: Supine to Sit     Supine to sit: Min guard     General bed mobility comments: close guard for safety.   Transfers Overall transfer level: Needs assistance Equipment used: Rolling walker (2 wheeled) Transfers: Sit to/from Stand Sit to Stand: Min guard;From elevated surface         General transfer comment: close guard for safety. VCs safety, hand/LE placement.   Ambulation/Gait Ambulation/Gait assistance: Min guard Ambulation Distance (Feet): 30 Feet Assistive device: Rolling walker (2 wheeled) Gait Pattern/deviations: Step-to pattern;Antalgic     General Gait Details: VCs safety, technique, sequence. Close guard for safety. Followed with recliner for safety  Stairs            Wheelchair Mobility    Modified Rankin (Stroke Patients Only)       Balance Overall balance assessment: Mild deficits observed, not formally tested                                           Pertinent  Vitals/Pain Pain Assessment: 0-10 Pain Score: 7  Pain Location: L knee Pain Descriptors / Indicators: Sore;Aching Pain Intervention(s): Repositioned;Ice applied;Patient requesting pain meds-RN notified    Home Living Family/patient expects to be discharged to:: Private residence Living Arrangements: Spouse/significant other Available Help at Discharge: Family Type of Home: House Home Access: Stairs to enter Entrance Stairs-Rails: None Entrance Stairs-Number of Steps: 2 Home Layout: One level Home Equipment: Environmental consultant - 2 wheels;Bedside commode      Prior Function Level of Independence: Independent               Hand Dominance        Extremity/Trunk Assessment   Upper Extremity Assessment Upper Extremity Assessment: Overall WFL for tasks assessed    Lower Extremity Assessment Lower Extremity Assessment: Generalized weakness(s/p L TKA)    Cervical / Trunk Assessment Cervical / Trunk Assessment: Normal  Communication   Communication: No difficulties  Cognition Arousal/Alertness: Awake/alert Behavior During Therapy: WFL for tasks assessed/performed Overall Cognitive Status: Within Functional Limits for tasks assessed                                        General Comments      Exercises     Assessment/Plan    PT Assessment Patient needs continued PT services  PT Problem List  Decreased strength;Decreased balance;Decreased range of motion;Decreased mobility;Decreased activity tolerance;Pain;Decreased knowledge of use of DME       PT Treatment Interventions DME instruction;Functional mobility training;Therapeutic activities;Balance training;Patient/family education;Therapeutic exercise;Stair training;Gait training    PT Goals (Current goals can be found in the Care Plan section)  Acute Rehab PT Goals Patient Stated Goal: regain plof. less pain.  PT Goal Formulation: With patient/family Time For Goal Achievement: 01/24/18 Potential to  Achieve Goals: Good    Frequency 7X/week   Barriers to discharge        Co-evaluation               AM-PAC PT "6 Clicks" Daily Activity  Outcome Measure Difficulty turning over in bed (including adjusting bedclothes, sheets and blankets)?: A Little Difficulty moving from lying on back to sitting on the side of the bed? : A Little Difficulty sitting down on and standing up from a chair with arms (e.g., wheelchair, bedside commode, etc,.)?: A Little Help needed moving to and from a bed to chair (including a wheelchair)?: A Little Help needed walking in hospital room?: A Little Help needed climbing 3-5 steps with a railing? : A Little 6 Click Score: 18    End of Session Equipment Utilized During Treatment: Gait belt;Left knee immobilizer Activity Tolerance: Patient tolerated treatment well Patient left: in chair;with call bell/phone within reach;with family/visitor present   PT Visit Diagnosis: Pain;Difficulty in walking, not elsewhere classified (R26.2) Pain - Right/Left: Left Pain - part of body: Knee    Time: 3790-2409 PT Time Calculation (min) (ACUTE ONLY): 25 min   Charges:   PT Evaluation $PT Eval Low Complexity: 1 Low PT Treatments $Gait Training: 8-22 mins   PT G Codes:          Weston Anna, MPT Pager: [phone]

## 2018-01-10 NOTE — Discharge Instructions (Signed)

## 2018-01-10 NOTE — Brief Op Note (Signed)
01/10/2018  10:32 AM  PATIENT:  Corey Clarke  62 y.o. male  PRE-OPERATIVE DIAGNOSIS:  Dengerative Joint Disease left knee  POST-OPERATIVE DIAGNOSIS:  Dengerative Joint Disease left knee  PROCEDURE:  Procedure(s) with comments: LEFT TOTAL KNEE ARTHROPLASTY (Left) - 120 mins  SURGEON:  Surgeon(s) and Role:    * Susa Day, MD - Primary    * Swinteck, Aaron Edelman, MD - Assisting  PHYSICIAN ASSISTANT:   ASSISTANTSPhoebe Sharps   ANESTHESIA:   general  EBL:  200 mL   BLOOD ADMINISTERED:none  DRAINS: none   LOCAL MEDICATIONS USED:  MARCAINE     SPECIMEN:  No Specimen  DISPOSITION OF SPECIMEN:  N/A  COUNTS:  YES  TOURNIQUET:   Total Tourniquet Time Documented: Thigh (Left) - 64 minutes Total: Thigh (Left) - 64 minutes   DICTATION: .Other Dictation: Dictation Number [id number]  PLAN OF CARE: Admit to inpatient   PATIENT DISPOSITION:  PACU - hemodynamically stable.   Delay start of Pharmacological VTE agent (>24hrs) due to surgical blood loss or risk of bleeding: no

## 2018-01-10 NOTE — Interval H&P Note (Signed)
History and Physical Interval Note:  01/10/2018 7:14 AM  Corey Clarke  has presented today for surgery, with the diagnosis of Dengerative Joint Disease left knee  The various methods of treatment have been discussed with the patient and family. After consideration of risks, benefits and other options for treatment, the patient has consented to  Procedure(s) with comments: LEFT TOTAL KNEE ARTHROPLASTY (Left) - 120 mins as a surgical intervention .  The patient's history has been reviewed, patient examined, no change in status, stable for surgery.  I have reviewed the patient's chart and labs.  Questions were answered to the patient's satisfaction.     Faron Tudisco C

## 2018-01-10 NOTE — Progress Notes (Signed)
Patient is admitted to 6E02 from PACU. admision vital sign is stable. Patient is AOX4

## 2018-01-10 NOTE — Anesthesia Postprocedure Evaluation (Signed)
Anesthesia Post Note  Patient: Corey Clarke  Procedure(s) Performed: LEFT TOTAL KNEE ARTHROPLASTY (Left Knee)     Patient location during evaluation: PACU Anesthesia Type: Spinal and Regional Level of consciousness: awake and alert Pain management: pain level controlled Vital Signs Assessment: post-procedure vital signs reviewed and stable Respiratory status: spontaneous breathing and respiratory function stable Cardiovascular status: blood pressure returned to baseline and stable Postop Assessment: spinal receding and no apparent nausea or vomiting Anesthetic complications: no    Last Vitals:  Vitals:   01/10/18 1130 01/10/18 1145  BP: 113/68 109/69  Pulse: 69 68  Resp: 14 14  Temp: 36.8 C (!) 36.4 C  SpO2: 99% 98%    Last Pain:  Vitals:   01/10/18 1202  TempSrc:   PainSc: 4                  Jamicheal Heard,W. EDMOND

## 2018-01-10 NOTE — Transfer of Care (Signed)
Immediate Anesthesia Transfer of Care Note  Patient: Corey Clarke  Procedure(s) Performed: LEFT TOTAL KNEE ARTHROPLASTY (Left Knee)  Patient Location: PACU  Anesthesia Type:MAC and Spinal  Level of Consciousness: awake, alert  and oriented  Airway & Oxygen Therapy: Patient Spontanous Breathing and Patient connected to face mask oxygen  Post-op Assessment: Report given to RN and Post -op Vital signs reviewed and stable  Post vital signs: Reviewed and stable  Last Vitals:  Vitals:   01/10/18 0607  BP: 134/90  Pulse: 73  Resp: 18  Temp: 36.9 C  SpO2: 95%    Last Pain:  Vitals:   01/10/18 0607  TempSrc: Oral  PainSc:          Complications: No apparent anesthesia complications

## 2018-01-10 NOTE — Anesthesia Procedure Notes (Signed)
Date/Time: 01/10/2018 7:36 AM Performed by: Glory Buff, CRNA Oxygen Delivery Method: Simple face mask

## 2018-01-10 NOTE — Anesthesia Procedure Notes (Signed)
Spinal  Patient location during procedure: OR Start time: 01/10/2018 7:39 AM End time: 01/10/2018 7:45 AM Staffing Anesthesiologist: Roderic Palau, MD Performed: anesthesiologist  Preanesthetic Checklist Completed: patient identified, surgical consent, pre-op evaluation, timeout performed, IV checked, risks and benefits discussed and monitors and equipment checked Spinal Block Patient position: sitting Prep: DuraPrep Patient monitoring: cardiac monitor, continuous pulse ox and blood pressure Approach: midline Location: L4-5 Injection technique: single-shot Needle Needle type: Pencan  Needle gauge: 24 G Needle length: 9 cm Assessment Sensory level: T8 Additional Notes Functioning IV was confirmed and monitors were applied. Sterile prep and drape, including hand hygiene and sterile gloves were used. The patient was positioned and the spine was prepped. The skin was anesthetized with lidocaine.  Free flow of clear CSF was obtained prior to injecting local anesthetic into the CSF.  The spinal needle aspirated freely following injection.  The needle was carefully withdrawn.  The patient tolerated the procedure well.

## 2018-01-11 ENCOUNTER — Encounter (HOSPITAL_COMMUNITY): Payer: Self-pay | Admitting: Specialist

## 2018-01-11 LAB — CBC
HCT: 36.5 % — ABNORMAL LOW (ref 39.0–52.0)
Hemoglobin: 12.5 g/dL — ABNORMAL LOW (ref 13.0–17.0)
MCH: 32.4 pg (ref 26.0–34.0)
MCHC: 34.2 g/dL (ref 30.0–36.0)
MCV: 94.6 fL (ref 78.0–100.0)
PLATELETS: 255 10*3/uL (ref 150–400)
RBC: 3.86 MIL/uL — ABNORMAL LOW (ref 4.22–5.81)
RDW: 13.3 % (ref 11.5–15.5)
WBC: 15 10*3/uL — ABNORMAL HIGH (ref 4.0–10.5)

## 2018-01-11 LAB — BASIC METABOLIC PANEL
Anion gap: 9 (ref 5–15)
BUN: 13 mg/dL (ref 6–20)
CO2: 25 mmol/L (ref 22–32)
Calcium: 8.8 mg/dL — ABNORMAL LOW (ref 8.9–10.3)
Chloride: 105 mmol/L (ref 101–111)
Creatinine, Ser: 0.76 mg/dL (ref 0.61–1.24)
GFR calc Af Amer: >60 mL/min (ref >60)
GLUCOSE: 176 mg/dL — AB (ref 65–99)
Potassium: 4.3 mmol/L (ref 3.5–5.1)
Sodium: 139 mmol/L (ref 135–145)

## 2018-01-11 NOTE — Progress Notes (Signed)
Physical Therapy Treatment Patient Details Name: Corey Clarke MRN: [id number] DOB: 07-Sep-1956 Today's Date: 01/11/2018    History of Present Illness 62 yo male s/p L TKA. 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  Applied freezer ice packs end of session.  Pt reports plan to d/c tomorrow.  Follow Up Recommendations  Follow surgeon's recommendation for DC plan and follow-up therapies     Equipment Recommendations  None recommended by PT    Recommendations for Other Services       Precautions / Restrictions Precautions Precautions: Fall;Knee Required Braces or Orthoses: Knee Immobilizer - Left Knee Immobilizer - Left: Discontinue once straight leg raise with < 10 degree lag Restrictions LLE Weight Bearing: Weight bearing as tolerated    Mobility  Bed Mobility               General bed mobility comments: pt up in recliner on arrival  Transfers Overall transfer level: Needs assistance Equipment used: Rolling walker (2 wheeled) Transfers: Sit to/from Stand Sit to Stand: Min guard         General transfer comment: min/guard for safety; able to recall positioning well  Ambulation/Gait Ambulation/Gait assistance: Min guard Ambulation Distance (Feet): 200 Feet Assistive device: Rolling walker (2 wheeled) Gait Pattern/deviations: Step-through pattern;Decreased stride length;Antalgic     General Gait Details: verbal cues for RW positioning and increasing knee flexion with swing as tolerated   Stairs            Wheelchair Mobility    Modified Rankin (Stroke Patients Only)       Balance                                            Cognition Arousal/Alertness: Awake/alert Behavior During Therapy: WFL for tasks assessed/performed Overall Cognitive Status: Within Functional Limits for tasks assessed                                         Exercises Total Joint Exercises Ankle Circles/Pumps: AROM;Both;10 reps Quad Sets: AROM;Left;10 reps Short Arc Quad: AROM;Left;10 reps Heel Slides: Left;AAROM;Seated;10 reps Hip ABduction/ADduction: AROM;Left;10 reps Straight Leg Raises: AROM;10 reps;Left Goniometric ROM: approximately -3-95* AAROM L knee    General Comments        Pertinent Vitals/Pain Pain Assessment: 0-10 Pain Score: 3  Pain Location: L knee Pain Descriptors / Indicators: Sore;Aching Pain Intervention(s): Limited activity within patient's tolerance;Repositioned;Monitored during session;Ice applied    Home Living                      Prior Function            PT Goals (current goals can now be found in the care plan section) Progress towards PT goals: Progressing toward goals    Frequency    7X/week      PT Plan Current plan remains appropriate    Co-evaluation              AM-PAC PT "6 Clicks" Daily Activity  Outcome Measure  Difficulty turning over in bed (including adjusting bedclothes, sheets and blankets)?: A Little Difficulty moving from lying on back to sitting on the side of the bed? :  A Little Difficulty sitting down on and standing up from a chair with arms (e.g., wheelchair, bedside commode, etc,.)?: A Little Help needed moving to and from a bed to chair (including a wheelchair)?: A Little Help needed walking in hospital room?: A Little Help needed climbing 3-5 steps with a railing? : A Little 6 Click Score: 18    End of Session Equipment Utilized During Treatment: Gait belt Activity Tolerance: Patient tolerated treatment well Patient left: with call bell/phone within reach;in bed;with family/visitor present   PT Visit Diagnosis: Difficulty in walking, not elsewhere classified (R26.2) Pain - Right/Left: Left Pain - part of body: Knee     Time: 1410-1419 PT Time Calculation (min) (ACUTE ONLY): 9 min  Charges:  $Gait Training: 8-22 mins                      G Codes:      Carmelia Bake, PT, DPT 01/11/2018 Pager: [phone]  York Ram E 01/11/2018, 3:51 PM

## 2018-01-11 NOTE — Progress Notes (Signed)
Physical Therapy Treatment Patient Details Name: Corey Clarke MRN: [id number] DOB: 01-May-1956 Today's Date: 01/11/2018    History of Present Illness 62 yo male s/p L TKA. 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  Pt reports possible d/c home tomorrow.   Follow Up Recommendations  Follow surgeon's recommendation for DC plan and follow-up therapies     Equipment Recommendations  None recommended by PT    Recommendations for Other Services       Precautions / Restrictions Precautions Precautions: Fall;Knee Required Braces or Orthoses: Knee Immobilizer - Left Knee Immobilizer - Left: Discontinue once straight leg raise with < 10 degree lag Restrictions LLE Weight Bearing: Weight bearing as tolerated    Mobility  Bed Mobility               General bed mobility comments: pt up in recliner on arrival  Transfers Overall transfer level: Needs assistance Equipment used: Rolling walker (2 wheeled) Transfers: Sit to/from Stand Sit to Stand: Min guard         General transfer comment: verbal cues for UE and LE positioning  Ambulation/Gait Ambulation/Gait assistance: Min guard Ambulation Distance (Feet): 120 Feet Assistive device: Rolling walker (2 wheeled) Gait Pattern/deviations: Step-through pattern;Decreased stride length;Antalgic     General Gait Details: verbal cues for RW positioning and posture   Stairs            Wheelchair Mobility    Modified Rankin (Stroke Patients Only)       Balance                                            Cognition Arousal/Alertness: Awake/alert Behavior During Therapy: WFL for tasks assessed/performed Overall Cognitive Status: Within Functional Limits for tasks assessed                                        Exercises Total Joint Exercises Ankle Circles/Pumps: AROM;Both;10 reps Quad Sets: AROM;Left;10  reps Short Arc Quad: AROM;Left;10 reps Heel Slides: Left;AAROM;Seated;10 reps Hip ABduction/ADduction: AROM;Left;10 reps Straight Leg Raises: AROM;10 reps;Left Goniometric ROM: approximately -3-95* AAROM L knee    General Comments        Pertinent Vitals/Pain Pain Assessment: 0-10 Pain Score: 4  Pain Location: L knee Pain Descriptors / Indicators: Sore;Aching Pain Intervention(s): Limited activity within patient's tolerance;Repositioned;Ice applied;Monitored during session;Premedicated before session    Home Living                      Prior Function            PT Goals (current goals can now be found in the care plan section) Progress towards PT goals: Progressing toward goals    Frequency    7X/week      PT Plan Current plan remains appropriate    Co-evaluation              AM-PAC PT "6 Clicks" Daily Activity  Outcome Measure  Difficulty turning over in bed (including adjusting bedclothes, sheets and blankets)?: A Little Difficulty moving from lying on back to sitting on the side of the bed? : A Little Difficulty sitting down on and standing up from a chair  with arms (e.g., wheelchair, bedside commode, etc,.)?: A Little Help needed moving to and from a bed to chair (including a wheelchair)?: A Little Help needed walking in hospital room?: A Little Help needed climbing 3-5 steps with a railing? : A Little 6 Click Score: 18    End of Session Equipment Utilized During Treatment: Gait belt Activity Tolerance: Patient tolerated treatment well Patient left: in chair;with call bell/phone within reach   PT Visit Diagnosis: Pain;Difficulty in walking, not elsewhere classified (R26.2) Pain - Right/Left: Left Pain - part of body: Knee     Time: 4462-8638 PT Time Calculation (min) (ACUTE ONLY): 19 min  Charges:  $Therapeutic Exercise: 8-22 mins                    G Codes:       Carmelia Bake, PT, DPT 01/11/2018 Pager:  [phone]  York Ram E 01/11/2018, 1:28 PM

## 2018-01-11 NOTE — Progress Notes (Signed)
Patient ID: Corey Clarke, male   DOB: 1956-11-08, 62 y.o.   MRN: [id number] Subjective: 1 Day Post-Op Procedure(s) (LRB): LEFT TOTAL KNEE ARTHROPLASTY (Left) Patient reports pain as moderate.    Patient has complaints of left knee pain.  We will start therapy today. Plan is to go home after hospital stay.  Objective: Vital signs in last 24 hours: Temp:  [97.5 F (36.4 C)-98.5 F (36.9 C)] 98 F (36.7 C) (02/22 0511) Pulse Rate:  [68-84] 79 (02/22 0511) Resp:  [14-18] 18 (02/22 0511) BP: (98-131)/(68-86) 131/72 (02/22 0511) SpO2:  [96 %-100 %] 99 % (02/22 0511) Weight:  [89.8 kg (198 lb 0.1 oz)] 89.8 kg (198 lb 0.1 oz) (02/21 1855)  Intake/Output from previous day:  Intake/Output Summary (Last 24 hours) at 01/11/2018 0848 Last data filed at 01/11/2018 0825 Gross per 24 hour  Intake 4425 ml  Output 3450 ml  Net 975 ml    Intake/Output this shift: Total I/O In: 240 [P.O.:240] Out: -   Labs: Results for orders placed or performed during the hospital encounter of 01/10/18  CBC  Result Value Ref Range   WBC 15.0 (H) 4.0 - 10.5 K/uL   RBC 3.86 (L) 4.22 - 5.81 MIL/uL   Hemoglobin 12.5 (L) 13.0 - 17.0 g/dL   HCT 36.5 (L) 39.0 - 52.0 %   MCV 94.6 78.0 - 100.0 fL   MCH 32.4 26.0 - 34.0 pg   MCHC 34.2 30.0 - 36.0 g/dL   RDW 13.3 11.5 - 15.5 %   Platelets 255 150 - 400 K/uL  Basic metabolic panel  Result Value Ref Range   Sodium 139 135 - 145 mmol/L   Potassium 4.3 3.5 - 5.1 mmol/L   Chloride 105 101 - 111 mmol/L   CO2 25 22 - 32 mmol/L   Glucose, Bld 176 (H) 65 - 99 mg/dL   BUN 13 6 - 20 mg/dL   Creatinine, Ser 0.76 0.61 - 1.24 mg/dL   Calcium 8.8 (L) 8.9 - 10.3 mg/dL   GFR calc non Af Amer >60 >60 mL/min   GFR calc Af Amer >60 >60 mL/min   Anion gap 9 5 - 15    Exam - Neurologically intact ABD soft Neurovascular intact Sensation intact distally Intact pulses distally Dorsiflexion/Plantar flexion intact Incision: dressing C/D/I and no drainage No cellulitis  present Compartment soft no calf pain or sign of DVT Dressing - clean, dry, no drainage Motor function intact - moving foot and toes well on exam.   Assessment/Plan: 1 Day Post-Op Procedure(s) (LRB): LEFT TOTAL KNEE ARTHROPLASTY (Left)  Advance diet Up with therapy D/C IV fluids Past Medical History:  Diagnosis Date  . Arthritis   . Complication of anesthesia   . Elevated cholesterol   . PONV (postoperative nausea and vomiting)    and abd pain    DVT Prophylaxis - ASA 325mg  BID Protocol Weight-Bearing as tolerated to Left leg No vaccines Possible d/c tomorrow if does well with PT today and pain remains well controlled  BISSELL, JACLYN M. 01/11/2018, 8:48 AM

## 2018-01-11 NOTE — Progress Notes (Signed)
OT Cancellation Note  Patient Details Name: AARUSH STUKEY MRN: [id number] DOB: 1956-04-05   Cancelled Treatment:    Reason Eval/Treat Not Completed: OT screened, no needs identified, will sign off  [place], Mickel Baas, [place] 01/11/2018, 12:21 PM

## 2018-01-11 NOTE — Op Note (Signed)
NAME:  Corey Clarke, Corey Clarke                        ACCOUNT NO.:  MEDICAL RECORD NO.:  [id number]  LOCATION:                                 FACILITY:  PHYSICIAN:  Susa Day, M.D.         DATE OF BIRTH:  DATE OF PROCEDURE: DATE OF DISCHARGE:                              OPERATIVE REPORT   PREOPERATIVE DIAGNOSIS:  End-stage osteoarthritis, left knee.  POSTOPERATIVE DIAGNOSIS:  End-stage osteoarthritis, left knee.  PROCEDURE PERFORMED:  Left total knee arthroplasty.  COMPONENTS:  DePuy Attune 7 femur, 8 tibia, 6 insert, 41 patella.  ANESTHESIA:  General.  ASSISTANT:  Cleophas Dunker, PA.  HISTORY:  A 62 year old bone-on-bone arthrosis, medial compartment, patellofemoral joint left knee, refractory to conservative treatment, indicated for replacement of the degenerated joint.  Risks and benefits discussed including bleeding, infection, damage to neurovascular structures, no change in symptoms, worsening symptoms, DVT, PE, anesthetic complications, etc.  TECHNIQUE:  With the patient in supine position after induction of adequate general anesthesia, 2 g Kefzol, left lower extremity was prepped, draped, and exsanguinated in usual sterile fashion.  Thigh tourniquet inflated at 225 mmHg.  Midline incision was made over the knee.  Full-thickness flaps developed.  Median parapatellar arthrotomy was performed.  Patella everted and knee flexed.  Severe osteoarthrosis in the medial compartment, particularly was noted in the patellofemoral joint.  Remnants of the medial and lateral menisci were removed as well as the ACL.  Step drill was utilized to enter the femoral canal was irrigated, 5-degree left was selected, with 9 off the distal femur. This was then pinned and we performed a distal femoral cut.  We then sized the femur off the anterior cortex lateral ridge to a 7.  It was pinned and 3 degrees of external rotation.  The femoral block was applied.  Anterior, posterior, and chamfer  cuts were then performed. The tibia was subluxed.  We measured 2 off the defect, which was medial. Bisecting the tibial talar joint distally.  3 degree slope, 2 off the defect first a little bit more than 10 off the lateral.  We readjusted it distally, re-pinned it for a satisfactory cut.  This cut was then performed.  Extension block was utilized and it was satisfactory at 6. We then went to complete the tibia, it was sized to an 8.  Just the medial aspect of tibial tubercle harvested bone centrally, used our central drill punch cut.  I then turned attention back to the femur, used our box cut.  With our jig, this was then pinned, we performed our box cut.  I then placed a trial femur on that.  It was slightly up laterally.  Removed it, took a small osteophyte of the posterior femoral condyle, reapplied it, drilled our holes and sat flush.  We reduced it with a 6 mm insert with full extension and good stability and full range.  Everted the patella with 24.  We planed it to a 15.  Measured it to a 41 medializing our PEG holes.  They were drilled and trial patella was placed, reduced, and had excellent patellofemoral tracking.  Next, all  instrumentation was then removed.  We checked posteriorly. The remnants of menisci were noted, they were removed.  Cauterized in the geniculates.  Pulsatile lavage was used in the joint.  Knee was flexed, tibia subluxed, all the surfaces thoroughly dried.  Mixed cement on the back table in the appropriate fashion.  We put our bone graft in the femoral canal.  Impacted it, all surfaces thoroughly dried.  Mixed cement on the back table in appropriate fashion was injected and digitally pressurized in the tibia and impacted the tibial tray, redundant cement removed.  We cemented and impacted the femur, redundant cement removed.  Cemented the patella and this was clamped.  Marcaine 0.25% with epinephrine was infiltrated into the periosteum of the joint. We  waited to appropriate curing of the cement with antibiotic irrigation of the wound.  Released the tourniquet and there was some bleeding noted posterolaterally.  We removed the insert, checked posteriorly with a Crego and a lamina spreader blunted at the geniculate vessels.  We used the Aquamantys to cauterize that, it seemed to cauterize that well.  I then flexed the knee up, placed our 7 insert in, reduced it.  We had full extension, full flexion, good stability, varus and valgus stress, 0 to 30 degrees, negative anterior drawer.  Again, we noted some bleeding on the lateral aspect of the lateral gutter.  Again, the region with the geniculate was bleeding previously.  I put some Gel-Foam there with thrombin-soaked Gelfoam; however, it continued to produce some bleeding, therefore prudent to remove the insert.  Once again, we did, again that geniculate was bleeding laterally.  The Aquamantys was revisited and with multiple attempts, we continued to have some bleeding of the lateral geniculate.  With augmented exposure, it was felt that there was some proximal extension of the bleeder on the posterior lateral femoral condyle, which was cauterized with the Aquamantys.  Following that, we achieved appropriate coagulation.  It was copiously irrigated.  Flexed the knee and replaced the insert.  Full extension, full flexion, good stability, varus and valgus stressing 0-30 degrees.  Negative anterior drawer.  Excellent patellofemoral tracking.  No active bleeding was noted.  We repaired the patellar arthrotomy with 1 Vicryl interrupted figure-of-eight sutures, running Stratafix, subcu with 2-0, and skin with staples.  Wound was dressed sterilely, placed in immobilizer, extubated without difficulty and transported to the recovery room in satisfactory condition.  The patient tolerated the procedure well.  There were no complications. Blood loss was 200 mL.     Susa Day,  M.D.     Geralynn Rile  D:  01/10/2018  T:  01/10/2018  Job:  [id number]

## 2018-01-11 NOTE — Discharge Summary (Addendum)
Physician Discharge Summary   Patient ID: Corey Clarke MRN: [id number] DOB/AGE: 12/26/1955 62 y.o.  Admit date: 01/10/2018 Discharge date: 01/12/2018  Primary Diagnosis: L knee primary osteoarthritis  Admission Diagnoses:  Past Medical History:  Diagnosis Date  . Arthritis   . Complication of anesthesia   . Elevated cholesterol   . PONV (postoperative nausea and vomiting)    and abd pain   Discharge Diagnoses:   Principal Problem:   Primary osteoarthritis of left knee Active Problems:   Left knee DJD  Estimated body mass index is 28.83 kg/m as calculated from the following:   Height as of this encounter: 5' 9.49" (1.765 m).   Weight as of this encounter: 89.8 kg (198 lb 0.1 oz).  Procedure:  Procedure(s) (LRB): LEFT TOTAL KNEE ARTHROPLASTY (Left)   Consults: None  HPI: see H&P Laboratory Data: Admission on 01/10/2018  Component Date Value Ref Range Status  . WBC 01/11/2018 15.0* 4.0 - 10.5 K/uL Final  . RBC 01/11/2018 3.86* 4.22 - 5.81 MIL/uL Final  . Hemoglobin 01/11/2018 12.5* 13.0 - 17.0 g/dL Final  . HCT 01/11/2018 36.5* 39.0 - 52.0 % Final  . MCV 01/11/2018 94.6  78.0 - 100.0 fL Final  . MCH 01/11/2018 32.4  26.0 - 34.0 pg Final  . MCHC 01/11/2018 34.2  30.0 - 36.0 g/dL Final  . RDW 01/11/2018 13.3  11.5 - 15.5 % Final  . Platelets 01/11/2018 255  150 - 400 K/uL Final   Performed at [facility], [place] [address] [place], [place] [address]  . Sodium 01/11/2018 139  135 - 145 mmol/L Final  . Potassium 01/11/2018 4.3  3.5 - 5.1 mmol/L Final  . Chloride 01/11/2018 105  101 - 111 mmol/L Final  . CO2 01/11/2018 25  22 - 32 mmol/L Final  . Glucose, Bld 01/11/2018 176* 65 - 99 mg/dL Final  . BUN 01/11/2018 13  6 - 20 mg/dL Final  . Creatinine, Ser 01/11/2018 0.76  0.61 - 1.24 mg/dL Final  . Calcium 01/11/2018 8.8* 8.9 - 10.3 mg/dL Final  . GFR calc non Af Amer 01/11/2018 >60  >60 mL/min Final  . GFR calc Af Amer 01/11/2018 >60  >60 mL/min  Final   Comment: (NOTE) The eGFR has been calculated using the CKD EPI equation. This calculation has not been validated in all clinical situations. eGFR's persistently <60 mL/min signify possible Chronic Kidney Disease.   Georgiann Hahn gap 01/11/2018 9  5 - 15 Final   Performed at [facility], [place] [address], [place], [place] [address]  Hospital Outpatient Visit on 01/04/2018  Component Date Value Ref Range Status  . aPTT 01/04/2018 26  24 - 36 seconds Final   Performed at [facility], [place] [address], [place], [place] [address]  . Sodium 01/04/2018 139  135 - 145 mmol/L Final  . Potassium 01/04/2018 4.0  3.5 - 5.1 mmol/L Final  . Chloride 01/04/2018 103  101 - 111 mmol/L Final  . CO2 01/04/2018 24  22 - 32 mmol/L Final  . Glucose, Bld 01/04/2018 83  65 - 99 mg/dL Final  . BUN 01/04/2018 17  6 - 20 mg/dL Final  . Creatinine, Ser 01/04/2018 0.70  0.61 - 1.24 mg/dL Final  . Calcium 01/04/2018 9.3  8.9 - 10.3 mg/dL Final  . GFR calc non Af Amer 01/04/2018 >60  >60 mL/min Final  . GFR calc Af Amer 01/04/2018 >60  >60 mL/min Final   Comment: (NOTE) The  eGFR has been calculated using the CKD EPI equation. This calculation has not been validated in all clinical situations. eGFR's persistently <60 mL/min signify possible Chronic Kidney Disease.   Georgiann Hahn gap 01/04/2018 12  5 - 15 Final   Performed at [facility], [place] [address], [place], [place] [address]  . WBC 01/04/2018 6.3  4.0 - 10.5 K/uL Final  . RBC 01/04/2018 4.64  4.22 - 5.81 MIL/uL Final  . Hemoglobin 01/04/2018 14.9  13.0 - 17.0 g/dL Final  . HCT 01/04/2018 43.2  39.0 - 52.0 % Final  . MCV 01/04/2018 93.1  78.0 - 100.0 fL Final  . MCH 01/04/2018 32.1  26.0 - 34.0 pg Final  . MCHC 01/04/2018 34.5  30.0 - 36.0 g/dL Final  . RDW 01/04/2018 13.1  11.5 - 15.5 % Final  . Platelets 01/04/2018 231  150 - 400 K/uL Final   Performed at [facility], [place]  [address], [place], [place] [address]  . Prothrombin Time 01/04/2018 12.1  11.4 - 15.2 seconds Final  . INR 01/04/2018 0.91   Final   Performed at [facility], [place] [address] [place], [place] [address]  . Color, Urine 01/04/2018 YELLOW  YELLOW Final  . APPearance 01/04/2018 CLEAR  CLEAR Final  . Specific Gravity, Urine 01/04/2018 1.015  1.005 - 1.030 Final  . pH 01/04/2018 5.0  5.0 - 8.0 Final  . Glucose, UA 01/04/2018 NEGATIVE  NEGATIVE mg/dL Final  . Hgb urine dipstick 01/04/2018 MODERATE* NEGATIVE Final  . Bilirubin Urine 01/04/2018 NEGATIVE  NEGATIVE Final  . Ketones, ur 01/04/2018 NEGATIVE  NEGATIVE mg/dL Final  . Protein, ur 01/04/2018 NEGATIVE  NEGATIVE mg/dL Final  . Nitrite 01/04/2018 NEGATIVE  NEGATIVE Final  . Leukocytes, UA 01/04/2018 NEGATIVE  NEGATIVE Final  . RBC / HPF 01/04/2018 NONE SEEN  0 - 5 RBC/hpf Final  . WBC, UA 01/04/2018 NONE SEEN  0 - 5 WBC/hpf Final  . Bacteria, UA 01/04/2018 NONE SEEN  NONE SEEN Final  . Squamous Epithelial / LPF 01/04/2018 NONE SEEN  NONE SEEN Final  . Mucus 01/04/2018 PRESENT   Final   Performed at [facility], [place] [address], [place], [place] [address]  . MRSA, PCR 01/04/2018 NEGATIVE  NEGATIVE Final  . Staphylococcus aureus 01/04/2018 NEGATIVE  NEGATIVE Final   Comment: (NOTE) The Xpert SA Assay (FDA approved for NASAL specimens in patients 53 years of age and older), is one component of a comprehensive surveillance program. It is not intended to diagnose infection nor to guide or monitor treatment. Performed at [facility], [place] [address], [place], [place] [address]      X-Rays:Dg Knee Left Port  Result Date: 01/10/2018 CLINICAL DATA:  Post left total knee replacement EXAM: PORTABLE LEFT KNEE - 1-2 VIEW COMPARISON:  None. FINDINGS: Changes of left knee replacement. Soft tissue and joint space gas. No hardware bony complicating feature. IMPRESSION: Left knee replacement.  No  complicating feature. Electronically Signed   By: Rolm Baptise M.D.   On: 01/10/2018 11:28    EKG:No orders found for this or any previous visit.   Hospital Course: Corey Clarke is a 62 y.o. who was admitted to [facility]. They were brought to the operating room on 01/10/2018 and underwent Procedure(s): LEFT TOTAL KNEE ARTHROPLASTY.  Patient tolerated the procedure well and was later transferred to the recovery room and then to the orthopaedic floor for postoperative care.  They were given  PO and IV analgesics for pain control following their surgery.  They were given 24 hours of postoperative antibiotics of  Anti-infectives (From admission, onward)   Start     Dose/Rate Route Frequency Ordered Stop   01/10/18 1400  ceFAZolin (ANCEF) IVPB 2g/100 mL premix     2 g 200 mL/hr over 30 Minutes Intravenous Every 6 hours 01/10/18 1200 01/11/18 0447   01/10/18 0827  polymyxin B 500,000 Units, bacitracin 50,000 Units in sodium chloride 0.9 % 500 mL irrigation  Status:  Discontinued       As needed 01/10/18 0827 01/10/18 1034   01/10/18 0600  ceFAZolin (ANCEF) IVPB 2g/100 mL premix     2 g 200 mL/hr over 30 Minutes Intravenous On call to O.R. 01/10/18 0540 01/10/18 0816     and started on DVT prophylaxis in the form of Aspirin, TED hose and SCDs.   PT and OT were ordered for total joint protocol.  Discharge planning consulted to help with postop disposition and equipment needs.  Patient had a good night on the evening of surgery.  They started to get up OOB with therapy on day one.  Continued to work with therapy into day two.  By day two, the patient had progressed with therapy and meeting their goals.  Incision was healing well.  Patient was seen in rounds and was ready to go home.   Diet: Regular diet Activity:WBAT Follow-up:in 10-14 days Disposition - Home with HHPT Discharged Condition: good    Allergies as of 01/11/2018      Reactions   Ciprofloxacin Hives   Other Nausea And  Vomiting   12-14-2016 anesthetics during spinal fusion procedure  cause nausea/vomiting      Medication List    STOP taking these medications   atorvastatin 10 MG tablet Commonly known as:  LIPITOR   Fish Oil-Flax Oil-Borage Oil Caps   tiZANidine 4 MG capsule Commonly known as:  ZANAFLEX     TAKE these medications   acetaminophen 650 MG CR tablet Commonly known as:  TYLENOL Take 1,300 mg by mouth 2 (two) times daily as needed for pain (knee pain).   aspirin EC 325 MG tablet Take 1 tablet (325 mg total) by mouth 2 (two) times daily.   diclofenac 75 MG EC tablet Commonly known as:  VOLTAREN Take 75 mg by mouth 2 (two) times daily.   docusate sodium 100 MG capsule Commonly known as:  COLACE Take 1 capsule (100 mg total) by mouth 2 (two) times daily as needed for mild constipation.   escitalopram 10 MG tablet Commonly known as:  LEXAPRO Take 10 mg by mouth at bedtime.   fexofenadine-pseudoephedrine 60-120 MG 12 hr tablet Commonly known as:  ALLEGRA-D Take 1 tablet by mouth daily as needed (allergies).   methocarbamol 500 MG tablet Commonly known as:  ROBAXIN Take 1 tablet (500 mg total) by mouth every 6 (six) hours as needed for muscle spasms.   multivitamin with minerals Tabs tablet Take 1 tablet by mouth daily. Men's 50 +   NASACORT ALLERGY 24HR NA Place 1 spray into the nose daily.   oxyCODONE-acetaminophen 5-325 MG tablet Commonly known as:  PERCOCET/ROXICET Take 1-2 tablets by mouth every 4 (four) hours as needed for severe pain. What changed:  how much to take   polyethylene glycol packet Commonly known as:  MIRALAX Take 17 g by mouth daily.   SUDAFED 30 MG tablet Generic drug:  pseudoephedrine Take 2 tablets by mouth 2 (two) times daily  as needed (sinus congestion). Takes only when out of allegra      Follow-up Information    Susa Day, MD Follow up in 2 week(s).   Specialty:  Orthopedic Surgery Contact information: [address] [place] [address] [phone]        [facility].   Specialty:  [place] Why:  physical therapy Contact information: [address] [place] [address] [phone]           Signed: Lacie Draft, PA-C Orthopaedic Surgery 01/11/2018, 4:18 PM

## 2018-01-11 NOTE — Progress Notes (Signed)
Discharge planning, spoke with patient and spouse at bedside. Have chosen [facility] for [facility] PT, evaluate and treat. Contacted [facility] for referral, requesting the same male therapist he had last year. Has RW and 3n1. [phone]

## 2018-01-12 LAB — CBC
HEMATOCRIT: 32.9 % — AB (ref 39.0–52.0)
Hemoglobin: 11.2 g/dL — ABNORMAL LOW (ref 13.0–17.0)
MCH: 32.7 pg (ref 26.0–34.0)
MCHC: 34 g/dL (ref 30.0–36.0)
MCV: 96.2 fL (ref 78.0–100.0)
PLATELETS: 260 10*3/uL (ref 150–400)
RBC: 3.42 MIL/uL — ABNORMAL LOW (ref 4.22–5.81)
RDW: 13.5 % (ref 11.5–15.5)
WBC: 9.9 10*3/uL (ref 4.0–10.5)

## 2018-01-12 NOTE — Progress Notes (Signed)
Subjective: 2 Days Post-Op Procedure(s) (LRB): LEFT TOTAL KNEE ARTHROPLASTY (Left) Patient reports pain as moderate.   L knee pain, no other c/o. Feels ready to go home. Seen by myself and Dr. Gladstone Lighter  Objective: Vital signs in last 24 hours: Temp:  [98 F (36.7 C)-98.2 F (36.8 C)] 98.2 F (36.8 C) (02/23 0450) Pulse Rate:  [72-80] 77 (02/23 0450) Resp:  [15-18] 15 (02/23 0450) BP: (131-135)/(71-84) 132/72 (02/23 0450) SpO2:  [95 %-99 %] 99 % (02/23 0450)  Intake/Output from previous day: 02/22 0701 - 02/23 0700 In: 1140 [P.O.:1140] Out: 1850 [Urine:1850] Intake/Output this shift: Total I/O In: -  Out: 250 [Urine:250]  Recent Labs    01/11/18 0524 01/12/18 0528  HGB 12.5* 11.2*   Recent Labs    01/11/18 0524 01/12/18 0528  WBC 15.0* 9.9  RBC 3.86* 3.42*  HCT 36.5* 32.9*  PLT 255 260   Recent Labs    01/11/18 0524  NA 139  K 4.3  CL 105  CO2 25  BUN 13  CREATININE 0.76  GLUCOSE 176*  CALCIUM 8.8*   No results for input(s): LABPT, INR in the last 72 hours.  Neurologically intact ABD soft Neurovascular intact Sensation intact distally Intact pulses distally Dorsiflexion/Plantar flexion intact Incision: dressing C/D/I and no drainage No cellulitis present Compartment soft no calf pain or sign of DVT  Assessment/Plan: 2 Days Post-Op Procedure(s) (LRB): LEFT TOTAL KNEE ARTHROPLASTY (Left) Advance diet Up with therapy D/C IV fluids  D/C home today Discussed strict ice and elevation toes above the nose at home to decrease swelling Discussed D/C instructions  Corey Clarke M. 01/12/2018, 9:09 AM

## 2018-01-12 NOTE — Progress Notes (Signed)
Physical Therapy Treatment Patient Details Name: Corey Clarke MRN: [id number] DOB: 10-Mar-1956 Today's Date: 01/12/2018    History of Present Illness 62 yo male s/p L TKA. Hx of R TKA 06/2017, spinal fusion 11/2016    PT Comments    Pt ambulated in hallway and reports 2 steps to get inside home.  Pt educated on safe stair technique and performed well.  Pt also performed LE exercises and provided with HEP handout.  Pt reports no further questions and feels ready for d/c home today.    Follow Up Recommendations  Follow surgeon's recommendation for DC plan and follow-up therapies     Equipment Recommendations  None recommended by PT    Recommendations for Other Services       Precautions / Restrictions Precautions Precautions: Fall;Knee Required Braces or Orthoses: Knee Immobilizer - Left Knee Immobilizer - Left: Discontinue once straight leg raise with < 10 degree lag Restrictions LLE Weight Bearing: Weight bearing as tolerated    Mobility  Bed Mobility Overal bed mobility: Needs Assistance Bed Mobility: Supine to Sit     Supine to sit: Supervision        Transfers Overall transfer level: Needs assistance Equipment used: Rolling walker (2 wheeled) Transfers: Sit to/from Stand Sit to Stand: Supervision         General transfer comment: able to recall positioning well  Ambulation/Gait Ambulation/Gait assistance: Min guard;Supervision Ambulation Distance (Feet): 120 Feet Assistive device: Rolling walker (2 wheeled) Gait Pattern/deviations: Step-through pattern;Decreased stride length;Antalgic     General Gait Details: verbal cues for RW positioning and increasing knee flexion with swing as tolerated; pt reports more stiffness and pain today, slight dizziness which did not worsen   Stairs Stairs: Yes   Stair Management: Step to pattern;Backwards;With walker Number of Stairs: 2 General stair comments: verbal cues for sequence, safety, RW positioning, pt  performed well and reports understanding, aware to have second person hold RW for safety  Wheelchair Mobility    Modified Rankin (Stroke Patients Only)       Balance                                            Cognition Arousal/Alertness: Awake/alert Behavior During Therapy: WFL for tasks assessed/performed Overall Cognitive Status: Within Functional Limits for tasks assessed                                        Exercises Total Joint Exercises Ankle Circles/Pumps: AROM;Both;10 reps Quad Sets: AROM;Left;10 reps Short Arc Quad: Left;10 reps;AROM Heel Slides: Left;10 reps;AAROM Hip ABduction/ADduction: Left;10 reps;AAROM Straight Leg Raises: 10 reps;Left;AAROM    General Comments        Pertinent Vitals/Pain Pain Assessment: 0-10 Pain Score: 4  Pain Location: L knee Pain Descriptors / Indicators: Sore;Aching Pain Intervention(s): Limited activity within patient's tolerance;Repositioned;Monitored during session;Patient requesting pain meds-RN notified    Home Living                      Prior Function            PT Goals (current goals can now be found in the care plan section) Progress towards PT goals: Progressing toward goals    Frequency    7X/week      PT Plan  Current plan remains appropriate    Co-evaluation              AM-PAC PT "6 Clicks" Daily Activity  Outcome Measure  Difficulty turning over in bed (including adjusting bedclothes, sheets and blankets)?: None Difficulty moving from lying on back to sitting on the side of the bed? : A Little Difficulty sitting down on and standing up from a chair with arms (e.g., wheelchair, bedside commode, etc,.)?: A Little Help needed moving to and from a bed to chair (including a wheelchair)?: A Little Help needed walking in hospital room?: A Little Help needed climbing 3-5 steps with a railing? : A Little 6 Click Score: 19    End of Session    Activity Tolerance: Patient tolerated treatment well Patient left: with call bell/phone within reach;in chair   PT Visit Diagnosis: Difficulty in walking, not elsewhere classified (R26.2)     Time: 7366-8159 PT Time Calculation (min) (ACUTE ONLY): 16 min  Charges:  $Gait Training: 8-22 mins                    G Codes:       Carmelia Bake, PT, DPT 01/12/2018 Pager: [phone]  York Ram E 01/12/2018, 11:56 AM

## 2018-11-08 ENCOUNTER — Encounter (INDEPENDENT_AMBULATORY_CARE_PROVIDER_SITE_OTHER): Payer: Self-pay | Admitting: *Deleted

## 2018-11-22 NOTE — H&P (Signed)
TOTAL HIP ADMISSION H&P  Patient is admitted for right total hip arthroplasty, anterior approach.  Subjective:  Chief Complaint:   Right hip primary OA / pain  HPI: Corey Clarke, 63 y.o. male, has a history of pain and functional disability in the right hip(s) due to arthritis and patient has failed non-surgical conservative treatments for greater than 12 weeks to include NSAID's and/or analgesics, use of assistive devices and activity modification.  Onset of symptoms was gradual starting 2+ years ago with gradually worsening course since that time.The patient noted no past surgery on the right hip(s).  Patient currently rates pain in the right hip at 7 out of 10 with activity. Patient has night pain, worsening of pain with activity and weight bearing, trendelenberg gait, pain that interfers with activities of daily living and pain with passive range of motion. Patient has evidence of periarticular osteophytes and joint space narrowing by imaging studies. This condition presents safety issues increasing the risk of falls.  There is no current active infection.  Risks, benefits and expectations were discussed with the patient.  Risks including but not limited to the risk of anesthesia, blood clots, nerve damage, blood vessel damage, failure of the prosthesis, infection and up to and including death.  Patient understand the risks, benefits and expectations and wishes to proceed with surgery.   PCP: Glenda Chroman, MD  D/C Plans:       Home  Post-op Meds:       No Rx given  Tranexamic Acid:      To be given - IV   Decadron:      Is to be given  FYI:      ASA  Oxycodone (on pre-op)  DME:   Pt already has equipment  PT:   No PT    Patient Active Problem List   Diagnosis Date Noted  . Primary osteoarthritis of left knee 01/10/2018  . Left knee DJD 01/10/2018  . Right knee DJD 06/21/2017  . ANKLE PAIN, RIGHT 03/03/2008   Past Medical History:  Diagnosis Date  . Arthritis   .  Complication of anesthesia   . Elevated cholesterol   . PONV (postoperative nausea and vomiting)    and abd pain    Past Surgical History:  Procedure Laterality Date  . BACK SURGERY     spinal fusion 3,4,5, 11/24/16 Dr. Wiliam Ke  . CARPAL TUNNEL RELEASE     left hand  . staph infection     Left elbow cleaned out 20 years plus  . TOTAL KNEE ARTHROPLASTY Right 06/21/2017   Procedure: RIGHT TOTAL KNEE ARTHROPLASTY;  Surgeon: Susa Day, MD;  Location: [facility];  Service: Orthopedics;  Laterality: Right;  120 mins  . TOTAL KNEE ARTHROPLASTY Left 01/10/2018   Procedure: LEFT TOTAL KNEE ARTHROPLASTY;  Surgeon: Susa Day, MD;  Location: [facility];  Service: Orthopedics;  Laterality: Left;  120 mins    No current facility-administered medications for this encounter.    Current Outpatient Medications  Medication Sig Dispense Refill Last Dose  . acetaminophen (TYLENOL) 650 MG CR tablet Take 1,300 mg by mouth 2 (two) times daily as needed for pain (knee pain).   01/09/2018 at am  . aspirin EC 325 MG tablet Take 1 tablet (325 mg total) by mouth 2 (two) times daily. 60 tablet 1   . diclofenac (VOLTAREN) 75 MG EC tablet Take 75 mg by mouth 2 (two) times daily.   01/05/2018 at Unknown time  . docusate sodium (  COLACE) 100 MG capsule Take 1 capsule (100 mg total) by mouth 2 (two) times daily as needed for mild constipation. 40 capsule 1   . escitalopram (LEXAPRO) 10 MG tablet Take 10 mg by mouth at bedtime.   01/09/2018 at pm  . fexofenadine-pseudoephedrine (ALLEGRA-D) 60-120 MG 12 hr tablet Take 1 tablet by mouth daily as needed (allergies).   Past Month at Unknown time  . methocarbamol (ROBAXIN) 500 MG tablet Take 1 tablet (500 mg total) by mouth every 6 (six) hours as needed for muscle spasms. 40 tablet 1   . Multiple Vitamin (MULTIVITAMIN WITH MINERALS) TABS tablet Take 1 tablet by mouth daily. Men's 50 +   01/05/2018  . oxyCODONE-acetaminophen (PERCOCET/ROXICET) 5-325 MG tablet Take 1-2 tablets by  mouth every 4 (four) hours as needed for severe pain. 40 tablet 0   . polyethylene glycol (MIRALAX) packet Take 17 g by mouth daily. 14 each 0   . pseudoephedrine (SUDAFED) 30 MG tablet Take 2 tablets by mouth 2 (two) times daily as needed (sinus congestion). Takes only when out of allegra   01/08/2018  . Triamcinolone Acetonide (NASACORT ALLERGY 24HR NA) Place 1 spray into the nose daily.   01/09/2018 at am   Allergies  Allergen Reactions  . Ciprofloxacin Hives  . Other Nausea And Vomiting    12-14-2016 anesthetics during spinal fusion procedure  cause nausea/vomiting    Social History   Tobacco Use  . Smoking status: Never Smoker  . Smokeless tobacco: Never Used  Substance Use Topics  . Alcohol use: Yes    Alcohol/week: 1.0 standard drinks    Types: 1 Cans of beer per week    Comment: 3-4 x week       Review of Systems  Constitutional: Negative.   HENT: Negative.   Eyes: Negative.   Respiratory: Negative.   Cardiovascular: Negative.   Gastrointestinal: Negative.   Genitourinary: Negative.   Musculoskeletal: Positive for back pain and joint pain.  Skin: Negative.   Neurological: Negative.   Endo/Heme/Allergies: Negative.   Psychiatric/Behavioral: Negative.     Objective:  Physical Exam  Constitutional: He is oriented to person, place, and time. He appears well-developed.  HENT:  Head: Normocephalic.  Eyes: Pupils are equal, round, and reactive to light.  Neck: Neck supple. No JVD present. No tracheal deviation present. No thyromegaly present.  Cardiovascular: Normal rate, regular rhythm and intact distal pulses.  Respiratory: Effort normal and breath sounds normal. No respiratory distress. He has no wheezes.  GI: Soft. There is no abdominal tenderness. There is no guarding.  Musculoskeletal:     Right hip: He exhibits decreased range of motion, decreased strength, tenderness and bony tenderness. He exhibits no swelling, no deformity and no laceration.   Lymphadenopathy:    He has no cervical adenopathy.  Neurological: He is alert and oriented to person, place, and time.  Skin: Skin is warm and dry.  Psychiatric: He has a normal mood and affect.      Labs:   Estimated body mass index is 28.83 kg/m as calculated from the following:   Height as of 01/10/18: 5' 9.49" (1.765 m).   Weight as of 01/10/18: 89.8 kg.   Imaging Review Plain radiographs demonstrate severe degenerative joint disease of the right hip(s). The bone quality appears to be good for age and reported activity level.    Preoperative templating of the joint replacement has been completed, documented, and submitted to the Operating Room personnel in order to optimize intra-operative equipment management.  Assessment/Plan:  End stage arthritis, right hip(s)  The patient history, physical examination, clinical judgement of the provider and imaging studies are consistent with end stage degenerative joint disease of the right hip(s) and total hip arthroplasty is deemed medically necessary. The treatment options including medical management, injection therapy, arthroscopy and arthroplasty were discussed at length. The risks and benefits of total hip arthroplasty were presented and reviewed. The risks due to aseptic loosening, infection, stiffness, dislocation/subluxation,  thromboembolic complications and other imponderables were discussed.  The patient acknowledged the explanation, agreed to proceed with the plan and consent was signed. Patient is being admitted for inpatient treatment for surgery, pain control, PT, OT, prophylactic antibiotics, VTE prophylaxis, progressive ambulation and ADL's and discharge planning.The patient is planning to be discharged home.   West Pugh Jarelly Rinck   PA-C  11/22/2018, 1:07 PM

## 2018-11-27 ENCOUNTER — Other Ambulatory Visit (INDEPENDENT_AMBULATORY_CARE_PROVIDER_SITE_OTHER): Payer: Self-pay | Admitting: *Deleted

## 2018-11-27 DIAGNOSIS — Z1211 Encounter for screening for malignant neoplasm of colon: Secondary | ICD-10-CM

## 2018-11-28 ENCOUNTER — Telehealth (INDEPENDENT_AMBULATORY_CARE_PROVIDER_SITE_OTHER): Payer: Self-pay | Admitting: *Deleted

## 2018-11-28 DIAGNOSIS — Z1211 Encounter for screening for malignant neoplasm of colon: Secondary | ICD-10-CM | POA: Insufficient documentation

## 2018-11-28 NOTE — Telephone Encounter (Signed)
Referring MD/PCP: vyas   Procedure: tcs w PROPOFOL (per NUR on 11/26/18)   Reason/Indication:  screening  Has patient had this procedure before?  Yes, 2009  If so, when, by whom and where?    Is there a family history of colon cancer?  no  Who?  What age when diagnosed?    Is patient diabetic?   no      Does patient have prosthetic heart valve or mechanical valve?  no  Do you have a pacemaker?  no  Has patient ever had endocarditis? no  Has patient had joint replacement within last 12 months?  Yes, left hip 12/10/18  Is patient constipated or do they take laxatives? no  Does patient have a history of alcohol/drug use?  no  Is patient on blood thinner such as Coumadin, Plavix and/or Aspirin? no  Medications: diclofenac 75 mg bid, escitalopram 10 mg daily, atorvastatin 10 mg daily, tizanidine 4 mg daily, oxycodone 5 mg qid  Allergies: cipro  Medication Adjustment per Dr Lindi Adie, NP:   Procedure date & time:

## 2018-12-05 ENCOUNTER — Encounter (HOSPITAL_COMMUNITY)
Admission: RE | Admit: 2018-12-05 | Discharge: 2018-12-05 | Disposition: A | Discharge status: Home / Self Care | Payer: 59 | Source: Ambulatory Visit | Attending: Orthopedic Surgery | Admitting: Orthopedic Surgery

## 2018-12-05 ENCOUNTER — Other Ambulatory Visit: Payer: Self-pay

## 2018-12-05 ENCOUNTER — Encounter (HOSPITAL_COMMUNITY): Payer: Self-pay

## 2018-12-05 DIAGNOSIS — Z01812 Encounter for preprocedural laboratory examination: Secondary | ICD-10-CM | POA: Insufficient documentation

## 2018-12-05 HISTORY — DX: Presence of unspecified artificial knee joint: Z96.659

## 2018-12-05 LAB — CBC
HCT: 44.9 % (ref 39.0–52.0)
Hemoglobin: 15.3 g/dL (ref 13.0–17.0)
MCH: 32.5 pg (ref 26.0–34.0)
MCHC: 34.1 g/dL (ref 30.0–36.0)
MCV: 95.3 fL (ref 80.0–100.0)
Platelets: 266 10*3/uL (ref 150–400)
RBC: 4.71 MIL/uL (ref 4.22–5.81)
RDW: 12.3 % (ref 11.5–15.5)
WBC: 7.3 10*3/uL (ref 4.0–10.5)
nRBC: 0 % (ref 0.0–0.2)

## 2018-12-05 LAB — BASIC METABOLIC PANEL
Anion gap: 7 (ref 5–15)
BUN: 22 mg/dL (ref 8–23)
CO2: 27 mmol/L (ref 22–32)
Calcium: 8.9 mg/dL (ref 8.9–10.3)
Chloride: 108 mmol/L (ref 98–111)
Creatinine, Ser: 0.88 mg/dL (ref 0.61–1.24)
GFR calc Af Amer: >60 mL/min (ref >60)
GFR calc non Af Amer: >60 mL/min (ref >60)
Glucose, Bld: 100 mg/dL — ABNORMAL HIGH (ref 70–99)
Potassium: 4 mmol/L (ref 3.5–5.1)
Sodium: 142 mmol/L (ref 135–145)

## 2018-12-05 NOTE — Patient Instructions (Signed)
Corey Clarke  12/05/2018   Your procedure is scheduled on: 12-10-2018    Report to [facility] Main  Entrance     Report to admitting at 5:30AM    Call this number if you have problems the morning of surgery [phone]      Remember: Do not eat food or drink liquids :After Midnight. BRUSH YOUR TEETH MORNING OF SURGERY AND RINSE YOUR MOUTH OUT, NO CHEWING GUM CANDY OR MINTS.     Take these medicines the morning of surgery with A SIP OF WATER: oxycodone if needed                                You may not have any metal on your body including hair pins and              piercings  Do not wear jewelry, make-up, lotions, powders or perfumes, deodorant                          Men may shave face and neck.   Do not bring valuables to the hospital. [place].  Contacts, dentures or bridgework may not be worn into surgery.  Leave suitcase in the car. After surgery it may be brought to your room.                   Please read over the following fact sheets you were given: _____________________________________________________________________             [facility] - Preparing for Surgery Before surgery, you can play an important role.  Because skin is not sterile, your skin needs to be as free of germs as possible.  You can reduce the number of germs on your skin by washing with CHG (chlorahexidine gluconate) soap before surgery.  CHG is an antiseptic cleaner which kills germs and bonds with the skin to continue killing germs even after washing. Please DO NOT use if you have an allergy to CHG or antibacterial soaps.  If your skin becomes reddened/irritated stop using the CHG and inform your nurse when you arrive at Short Stay. Do not shave (including legs and underarms) for at least 48 hours prior to the first CHG shower.  You may shave your face/neck. Please follow these instructions carefully:  1.   Shower with CHG Soap the night before surgery and the  morning of Surgery.  2.  If you choose to wash your hair, wash your hair first as usual with your  normal  shampoo.  3.  After you shampoo, rinse your hair and body thoroughly to remove the  shampoo.                           4.  Use CHG as you would any other liquid soap.  You can apply chg directly  to the skin and wash                       Gently with a scrungie or clean washcloth.  5.  Apply the CHG Soap to your body ONLY FROM THE NECK DOWN.   Do not use  on face/ open                           Wound or open sores. Avoid contact with eyes, ears mouth and genitals (private parts).                       Wash face,  Genitals (private parts) with your normal soap.             6.  Wash thoroughly, paying special attention to the area where your surgery  will be performed.  7.  Thoroughly rinse your body with warm water from the neck down.  8.  DO NOT shower/wash with your normal soap after using and rinsing off  the CHG Soap.                9.  Pat yourself dry with a clean towel.            10.  Wear clean pajamas.            11.  Place clean sheets on your bed the night of your first shower and do not  sleep with pets. Day of Surgery : Do not apply any lotions/deodorants the morning of surgery.  Please wear clean clothes to the hospital/surgery center.  FAILURE TO FOLLOW THESE INSTRUCTIONS MAY RESULT IN THE CANCELLATION OF YOUR SURGERY PATIENT SIGNATURE_________________________________  NURSE SIGNATURE__________________________________  ________________________________________________________________________   Corey Clarke  An incentive spirometer is a tool that can help keep your lungs clear and active. This tool measures how well you are filling your lungs with each breath. Taking long deep breaths may help reverse or decrease the chance of developing breathing (pulmonary) problems (especially infection) following:  A long  period of time when you are unable to move or be active. BEFORE THE PROCEDURE   If the spirometer includes an indicator to show your best effort, your nurse or respiratory therapist will set it to a desired goal.  If possible, sit up straight or lean slightly forward. Try not to slouch.  Hold the incentive spirometer in an upright position. INSTRUCTIONS FOR USE  1. Sit on the edge of your bed if possible, or sit up as far as you can in bed or on a chair. 2. Hold the incentive spirometer in an upright position. 3. Breathe out normally. 4. Place the mouthpiece in your mouth and seal your lips tightly around it. 5. Breathe in slowly and as deeply as possible, raising the piston or the ball toward the top of the column. 6. Hold your breath for 3-5 seconds or for as long as possible. Allow the piston or ball to fall to the bottom of the column. 7. Remove the mouthpiece from your mouth and breathe out normally. 8. Rest for a few seconds and repeat Steps 1 through 7 at least 10 times every 1-2 hours when you are awake. Take your time and take a few normal breaths between deep breaths. 9. The spirometer may include an indicator to show your best effort. Use the indicator as a goal to work toward during each repetition. 10. After each set of 10 deep breaths, practice coughing to be sure your lungs are clear. If you have an incision (the cut made at the time of surgery), support your incision when coughing by placing a pillow or rolled up towels firmly against it. Once you are able to get out of bed, walk around  indoors and cough well. You may stop using the incentive spirometer when instructed by your caregiver.  RISKS AND COMPLICATIONS  Take your time so you do not get dizzy or light-headed.  If you are in pain, you may need to take or ask for pain medication before doing incentive spirometry. It is harder to take a deep breath if you are having pain. AFTER USE  Rest and breathe slowly and  easily.  It can be helpful to keep track of a log of your progress. Your caregiver can provide you with a simple table to help with this. If you are using the spirometer at home, follow these instructions: [place] IF:   You are having difficultly using the spirometer.  You have trouble using the spirometer as often as instructed.  Your pain medication is not giving enough relief while using the spirometer.  You develop fever of 100.5 F (38.1 C) or higher. SEEK IMMEDIATE MEDICAL CARE IF:   You cough up bloody sputum that had not been present before.  You develop fever of 102 F (38.9 C) or greater.  You develop worsening pain at or near the incision site. MAKE SURE YOU:   Understand these instructions.  Will watch your condition.  Will get help right away if you are not doing well or get worse. Document Released: 03/19/2007 Document Revised: 01/29/2012 Document Reviewed: 05/20/2007 Exit[employer] Patient Information 2014 Exit[employer], [place].   ________________________________________________________________________  WHAT IS A BLOOD TRANSFUSION? Blood Transfusion Information  A transfusion is the replacement of blood or some of its parts. Blood is made up of multiple cells which provide different functions.  Red blood cells carry oxygen and are used for blood loss replacement.  White blood cells fight against infection.  Platelets control bleeding.  Plasma helps clot blood.  Other blood products are available for specialized needs, such as hemophilia or other clotting disorders. BEFORE THE TRANSFUSION  Who gives blood for transfusions?   Healthy volunteers who are fully evaluated to make sure their blood is safe. This is blood bank blood. Transfusion therapy is the safest it has ever been in the practice of medicine. Before blood is taken from a donor, a complete history is taken to make sure that person has no history of diseases nor engages in risky social  behavior (examples are intravenous drug use or sexual activity with multiple partners). The donor's travel history is screened to minimize risk of transmitting infections, such as malaria. The donated blood is tested for signs of infectious diseases, such as HIV and hepatitis. The blood is then tested to be sure it is compatible with you in order to minimize the chance of a transfusion reaction. If you or a relative donates blood, this is often done in anticipation of surgery and is not appropriate for emergency situations. It takes many days to process the donated blood. RISKS AND COMPLICATIONS Although transfusion therapy is very safe and saves many lives, the main dangers of transfusion include:   Getting an infectious disease.  Developing a transfusion reaction. This is an allergic reaction to something in the blood you were given. Every precaution is taken to prevent this. The decision to have a blood transfusion has been considered carefully by your caregiver before blood is given. Blood is not given unless the benefits outweigh the risks. AFTER THE TRANSFUSION  Right after receiving a blood transfusion, you will usually feel much better and more energetic. This is especially true if your red blood cells have gotten  low (anemic). The transfusion raises the level of the red blood cells which carry oxygen, and this usually causes an energy increase.  The nurse administering the transfusion will monitor you carefully for complications. HOME CARE INSTRUCTIONS  No special instructions are needed after a transfusion. You may find your energy is better. Speak with your caregiver about any limitations on activity for underlying diseases you may have. SEEK MEDICAL CARE IF:   Your condition is not improving after your transfusion.  You develop redness or irritation at the intravenous (IV) site. SEEK IMMEDIATE MEDICAL CARE IF:  Any of the following symptoms occur over the next 12 hours:  Shaking  chills.  You have a temperature by mouth above 102 F (38.9 C), not controlled by medicine.  Chest, back, or muscle pain.  People around you feel you are not acting correctly or are confused.  Shortness of breath or difficulty breathing.  Dizziness and fainting.  You get a rash or develop hives.  You have a decrease in urine output.  Your urine turns a dark color or changes to pink, red, or brown. Any of the following symptoms occur over the next 10 days:  You have a temperature by mouth above 102 F (38.9 C), not controlled by medicine.  Shortness of breath.  Weakness after normal activity.  The white part of the eye turns yellow (jaundice).  You have a decrease in the amount of urine or are urinating less often.  Your urine turns a dark color or changes to pink, red, or brown. Document Released: 11/03/2000 Document Revised: 01/29/2012 Document Reviewed: 06/22/2008 [facility] Patient Information 2014 [place], [place].  _______________________________________________________________________

## 2018-12-06 LAB — SURGICAL PCR SCREEN
MRSA, PCR: POSITIVE — AB
Staphylococcus aureus: POSITIVE — AB

## 2018-12-10 ENCOUNTER — Inpatient Hospital Stay (HOSPITAL_COMMUNITY)
Admission: RE | Admit: 2018-12-10 | Discharge: 2018-12-11 | DRG: 470 | Disposition: A | Discharge status: Home / Self Care | Payer: 59 | Attending: Orthopedic Surgery | Admitting: Orthopedic Surgery

## 2018-12-10 ENCOUNTER — Inpatient Hospital Stay (HOSPITAL_COMMUNITY): Payer: 59 | Admitting: Certified Registered"

## 2018-12-10 ENCOUNTER — Inpatient Hospital Stay (HOSPITAL_COMMUNITY): Payer: 59

## 2018-12-10 ENCOUNTER — Encounter (HOSPITAL_COMMUNITY): Payer: Self-pay | Admitting: Emergency Medicine

## 2018-12-10 ENCOUNTER — Inpatient Hospital Stay (HOSPITAL_COMMUNITY): Payer: 59 | Admitting: Physician Assistant

## 2018-12-10 ENCOUNTER — Other Ambulatory Visit: Payer: Self-pay

## 2018-12-10 ENCOUNTER — Encounter (HOSPITAL_COMMUNITY)
Admission: RE | Disposition: A | Discharge status: Home / Self Care | Payer: Self-pay | Source: Home / Self Care | Attending: Orthopedic Surgery

## 2018-12-10 DIAGNOSIS — Z981 Arthrodesis status: Secondary | ICD-10-CM | POA: Diagnosis not present

## 2018-12-10 DIAGNOSIS — Z419 Encounter for procedure for purposes other than remedying health state, unspecified: Secondary | ICD-10-CM

## 2018-12-10 DIAGNOSIS — Z79891 Long term (current) use of opiate analgesic: Secondary | ICD-10-CM

## 2018-12-10 DIAGNOSIS — E663 Overweight: Secondary | ICD-10-CM | POA: Diagnosis present

## 2018-12-10 DIAGNOSIS — Z96641 Presence of right artificial hip joint: Secondary | ICD-10-CM

## 2018-12-10 DIAGNOSIS — Z79899 Other long term (current) drug therapy: Secondary | ICD-10-CM

## 2018-12-10 DIAGNOSIS — E7800 Pure hypercholesterolemia, unspecified: Secondary | ICD-10-CM | POA: Diagnosis present

## 2018-12-10 DIAGNOSIS — M1611 Unilateral primary osteoarthritis, right hip: Secondary | ICD-10-CM | POA: Diagnosis present

## 2018-12-10 DIAGNOSIS — Z888 Allergy status to other drugs, medicaments and biological substances status: Secondary | ICD-10-CM | POA: Diagnosis not present

## 2018-12-10 DIAGNOSIS — Z7982 Long term (current) use of aspirin: Secondary | ICD-10-CM | POA: Diagnosis not present

## 2018-12-10 DIAGNOSIS — M25551 Pain in right hip: Secondary | ICD-10-CM | POA: Diagnosis present

## 2018-12-10 DIAGNOSIS — Z6828 Body mass index (BMI) 28.0-28.9, adult: Secondary | ICD-10-CM | POA: Diagnosis not present

## 2018-12-10 DIAGNOSIS — Z881 Allergy status to other antibiotic agents status: Secondary | ICD-10-CM

## 2018-12-10 DIAGNOSIS — Z96653 Presence of artificial knee joint, bilateral: Secondary | ICD-10-CM | POA: Diagnosis present

## 2018-12-10 DIAGNOSIS — Z96649 Presence of unspecified artificial hip joint: Secondary | ICD-10-CM

## 2018-12-10 HISTORY — PX: TOTAL HIP ARTHROPLASTY: SHX124

## 2018-12-10 LAB — TYPE AND SCREEN
ABO/RH(D): A POS
Antibody Screen: NEGATIVE

## 2018-12-10 SURGERY — ARTHROPLASTY, HIP, TOTAL, ANTERIOR APPROACH
Anesthesia: Spinal | Site: Hip | Laterality: Right

## 2018-12-10 MED ORDER — FENTANYL CITRATE (PF) 100 MCG/2ML IJ SOLN
INTRAMUSCULAR | Status: AC
  Filled 2018-12-10: qty 2

## 2018-12-10 MED ORDER — HYDROMORPHONE HCL 1 MG/ML IJ SOLN: 0.5000 mg | INTRAMUSCULAR | Status: DC | PRN

## 2018-12-10 MED ORDER — PROPOFOL 500 MG/50ML IV EMUL
INTRAVENOUS | Status: DC | PRN
  Administered 2018-12-10: 100 ug/kg/min via INTRAVENOUS

## 2018-12-10 MED ORDER — HYDROMORPHONE HCL 1 MG/ML IJ SOLN: 0.2500 mg | INTRAMUSCULAR | Status: DC | PRN

## 2018-12-10 MED ORDER — BUPIVACAINE IN DEXTROSE 0.75-8.25 % IT SOLN
INTRATHECAL | Status: DC | PRN
  Administered 2018-12-10: 1.8 mL via INTRATHECAL

## 2018-12-10 MED ORDER — PHENYLEPHRINE 40 MCG/ML (10ML) SYRINGE FOR IV PUSH (FOR BLOOD PRESSURE SUPPORT)
PREFILLED_SYRINGE | INTRAVENOUS | Status: DC | PRN
  Administered 2018-12-10 (×4): 80 ug via INTRAVENOUS

## 2018-12-10 MED ORDER — DEXAMETHASONE SODIUM PHOSPHATE 10 MG/ML IJ SOLN
INTRAMUSCULAR | Status: AC
  Filled 2018-12-10: qty 1

## 2018-12-10 MED ORDER — LACTATED RINGERS IV SOLN
INTRAVENOUS | Status: DC
  Administered 2018-12-10 (×3): via INTRAVENOUS

## 2018-12-10 MED ORDER — ESCITALOPRAM OXALATE 10 MG PO TABS
10.0000 mg | ORAL_TABLET | Freq: Every day | ORAL | Status: DC
  Administered 2018-12-10: 10 mg via ORAL
  Filled 2018-12-10: qty 1

## 2018-12-10 MED ORDER — METHOCARBAMOL 500 MG IVPB - SIMPLE MED
500.0000 mg | Freq: Four times a day (QID) | INTRAVENOUS | Status: DC | PRN
  Filled 2018-12-10: qty 50

## 2018-12-10 MED ORDER — OXYCODONE HCL 5 MG PO TABS: 5.0000 mg | ORAL_TABLET | Freq: Once | ORAL | Status: DC | PRN

## 2018-12-10 MED ORDER — OXYCODONE HCL 5 MG PO TABS: 20.0000 mg | ORAL_TABLET | ORAL | Status: DC | PRN

## 2018-12-10 MED ORDER — BISACODYL 10 MG RE SUPP: 10.0000 mg | Freq: Every day | RECTAL | Status: DC | PRN

## 2018-12-10 MED ORDER — CELECOXIB 200 MG PO CAPS
200.0000 mg | ORAL_CAPSULE | Freq: Two times a day (BID) | ORAL | Status: DC
  Administered 2018-12-10 – 2018-12-11 (×3): 200 mg via ORAL
  Filled 2018-12-10 (×3): qty 1

## 2018-12-10 MED ORDER — MAGNESIUM CITRATE PO SOLN: 1.0000 | Freq: Once | ORAL | Status: DC | PRN

## 2018-12-10 MED ORDER — METHOCARBAMOL 500 MG PO TABS: 500.0000 mg | ORAL_TABLET | Freq: Four times a day (QID) | ORAL | 0 refills | Status: DC | PRN

## 2018-12-10 MED ORDER — MENTHOL 3 MG MT LOZG: 1.0000 | LOZENGE | OROMUCOSAL | Status: DC | PRN

## 2018-12-10 MED ORDER — OXYCODONE HCL 5 MG PO TABS
10.0000 mg | ORAL_TABLET | ORAL | Status: DC | PRN
  Administered 2018-12-10 – 2018-12-11 (×6): 10 mg via ORAL
  Filled 2018-12-10 (×6): qty 2

## 2018-12-10 MED ORDER — EPHEDRINE SULFATE-NACL 50-0.9 MG/10ML-% IV SOSY
PREFILLED_SYRINGE | INTRAVENOUS | Status: DC | PRN
  Administered 2018-12-10 (×2): 10 mg via INTRAVENOUS

## 2018-12-10 MED ORDER — DEXAMETHASONE SODIUM PHOSPHATE 10 MG/ML IJ SOLN
10.0000 mg | Freq: Once | INTRAMUSCULAR | Status: AC
  Administered 2018-12-11: 10 mg via INTRAVENOUS
  Filled 2018-12-10: qty 1

## 2018-12-10 MED ORDER — STERILE WATER FOR IRRIGATION IR SOLN
Status: DC | PRN
  Administered 2018-12-10: 2000 mL

## 2018-12-10 MED ORDER — POLYETHYLENE GLYCOL 3350 17 G PO PACK: 17.0000 g | PACK | Freq: Two times a day (BID) | ORAL | 0 refills | Status: DC

## 2018-12-10 MED ORDER — DIPHENHYDRAMINE HCL 12.5 MG/5ML PO ELIX: 12.5000 mg | ORAL_SOLUTION | ORAL | Status: DC | PRN

## 2018-12-10 MED ORDER — DOCUSATE SODIUM 100 MG PO CAPS: 100.0000 mg | ORAL_CAPSULE | Freq: Two times a day (BID) | ORAL | 0 refills | Status: DC

## 2018-12-10 MED ORDER — MIDAZOLAM HCL 2 MG/2ML IJ SOLN
INTRAMUSCULAR | Status: AC
  Filled 2018-12-10: qty 2

## 2018-12-10 MED ORDER — METHOCARBAMOL 500 MG PO TABS
500.0000 mg | ORAL_TABLET | Freq: Four times a day (QID) | ORAL | Status: DC | PRN
  Administered 2018-12-10 (×2): 500 mg via ORAL
  Filled 2018-12-10 (×2): qty 1

## 2018-12-10 MED ORDER — ACETAMINOPHEN 500 MG PO TABS
1000.0000 mg | ORAL_TABLET | Freq: Four times a day (QID) | ORAL | Status: AC
  Administered 2018-12-10 – 2018-12-11 (×4): 1000 mg via ORAL
  Filled 2018-12-10 (×4): qty 2

## 2018-12-10 MED ORDER — CEFAZOLIN SODIUM-DEXTROSE 2-4 GM/100ML-% IV SOLN
2.0000 g | Freq: Four times a day (QID) | INTRAVENOUS | Status: AC
  Administered 2018-12-10 (×2): 2 g via INTRAVENOUS
  Filled 2018-12-10 (×2): qty 100

## 2018-12-10 MED ORDER — ASPIRIN 81 MG PO CHEW
81.0000 mg | CHEWABLE_TABLET | Freq: Two times a day (BID) | ORAL | Status: DC
  Administered 2018-12-10 – 2018-12-11 (×2): 81 mg via ORAL
  Filled 2018-12-10 (×2): qty 1

## 2018-12-10 MED ORDER — TRANEXAMIC ACID-NACL 1000-0.7 MG/100ML-% IV SOLN
1000.0000 mg | Freq: Once | INTRAVENOUS | Status: AC
  Administered 2018-12-10: 1000 mg via INTRAVENOUS
  Filled 2018-12-10: qty 100

## 2018-12-10 MED ORDER — POLYETHYLENE GLYCOL 3350 17 G PO PACK
17.0000 g | PACK | Freq: Two times a day (BID) | ORAL | Status: DC
  Administered 2018-12-10: 17 g via ORAL
  Filled 2018-12-10 (×2): qty 1

## 2018-12-10 MED ORDER — CHLORHEXIDINE GLUCONATE 4 % EX LIQD: 60.0000 mL | Freq: Once | CUTANEOUS | Status: DC

## 2018-12-10 MED ORDER — PHENOL 1.4 % MT LIQD: 1.0000 | OROMUCOSAL | Status: DC | PRN

## 2018-12-10 MED ORDER — FERROUS SULFATE 325 (65 FE) MG PO TABS
325.0000 mg | ORAL_TABLET | Freq: Three times a day (TID) | ORAL | Status: DC
  Administered 2018-12-10 – 2018-12-11 (×2): 325 mg via ORAL
  Filled 2018-12-10 (×2): qty 1

## 2018-12-10 MED ORDER — EPHEDRINE 5 MG/ML INJ
INTRAVENOUS | Status: AC
  Filled 2018-12-10: qty 10

## 2018-12-10 MED ORDER — METOCLOPRAMIDE HCL 5 MG PO TABS: 5.0000 mg | ORAL_TABLET | Freq: Three times a day (TID) | ORAL | Status: DC | PRN

## 2018-12-10 MED ORDER — MIDAZOLAM HCL 5 MG/5ML IJ SOLN
INTRAMUSCULAR | Status: DC | PRN
  Administered 2018-12-10: 2 mg via INTRAVENOUS

## 2018-12-10 MED ORDER — ONDANSETRON HCL 4 MG/2ML IJ SOLN: 4.0000 mg | Freq: Four times a day (QID) | INTRAMUSCULAR | Status: DC | PRN

## 2018-12-10 MED ORDER — ONDANSETRON HCL 4 MG/2ML IJ SOLN
INTRAMUSCULAR | Status: DC | PRN
  Administered 2018-12-10: 4 mg via INTRAVENOUS

## 2018-12-10 MED ORDER — PROMETHAZINE HCL 25 MG/ML IJ SOLN: 6.2500 mg | INTRAMUSCULAR | Status: DC | PRN

## 2018-12-10 MED ORDER — LIDOCAINE 2% (20 MG/ML) 5 ML SYRINGE
INTRAMUSCULAR | Status: DC | PRN
  Administered 2018-12-10: 50 mg via INTRAVENOUS

## 2018-12-10 MED ORDER — ASPIRIN 81 MG PO CHEW: 81.0000 mg | CHEWABLE_TABLET | Freq: Two times a day (BID) | ORAL | 0 refills | Status: AC

## 2018-12-10 MED ORDER — VANCOMYCIN HCL IN DEXTROSE 1-5 GM/200ML-% IV SOLN
1000.0000 mg | Freq: Once | INTRAVENOUS | Status: AC
  Administered 2018-12-10: 1000 mg via INTRAVENOUS
  Filled 2018-12-10: qty 200

## 2018-12-10 MED ORDER — PHENYLEPHRINE 40 MCG/ML (10ML) SYRINGE FOR IV PUSH (FOR BLOOD PRESSURE SUPPORT)
PREFILLED_SYRINGE | INTRAVENOUS | Status: AC
  Filled 2018-12-10: qty 10

## 2018-12-10 MED ORDER — CEFAZOLIN SODIUM-DEXTROSE 2-4 GM/100ML-% IV SOLN
2.0000 g | INTRAVENOUS | Status: AC
  Administered 2018-12-10: 2 g via INTRAVENOUS
  Filled 2018-12-10: qty 100

## 2018-12-10 MED ORDER — ATORVASTATIN CALCIUM 10 MG PO TABS
10.0000 mg | ORAL_TABLET | Freq: Every day | ORAL | Status: DC
  Administered 2018-12-10: 10 mg via ORAL
  Filled 2018-12-10: qty 1

## 2018-12-10 MED ORDER — PROPOFOL 10 MG/ML IV BOLUS
INTRAVENOUS | Status: AC
  Filled 2018-12-10: qty 20

## 2018-12-10 MED ORDER — OXYCODONE HCL 5 MG PO TABS: 5.0000 mg | ORAL_TABLET | ORAL | 0 refills | Status: AC | PRN

## 2018-12-10 MED ORDER — LIDOCAINE 2% (20 MG/ML) 5 ML SYRINGE
INTRAMUSCULAR | Status: AC
  Filled 2018-12-10: qty 5

## 2018-12-10 MED ORDER — ACETAMINOPHEN 500 MG PO TABS: 1000.0000 mg | ORAL_TABLET | Freq: Three times a day (TID) | ORAL | 0 refills | Status: DC

## 2018-12-10 MED ORDER — FENTANYL CITRATE (PF) 100 MCG/2ML IJ SOLN
INTRAMUSCULAR | Status: DC | PRN
  Administered 2018-12-10 (×2): 50 ug via INTRAVENOUS

## 2018-12-10 MED ORDER — DOCUSATE SODIUM 100 MG PO CAPS
100.0000 mg | ORAL_CAPSULE | Freq: Two times a day (BID) | ORAL | Status: DC
  Administered 2018-12-10 – 2018-12-11 (×2): 100 mg via ORAL
  Filled 2018-12-10 (×2): qty 1

## 2018-12-10 MED ORDER — OXYCODONE HCL 5 MG/5ML PO SOLN
5.0000 mg | Freq: Once | ORAL | Status: DC | PRN
  Filled 2018-12-10: qty 5

## 2018-12-10 MED ORDER — PROPOFOL 10 MG/ML IV BOLUS
INTRAVENOUS | Status: AC
  Filled 2018-12-10: qty 40

## 2018-12-10 MED ORDER — ONDANSETRON HCL 4 MG/2ML IJ SOLN
INTRAMUSCULAR | Status: AC
  Filled 2018-12-10: qty 2

## 2018-12-10 MED ORDER — DEXAMETHASONE SODIUM PHOSPHATE 10 MG/ML IJ SOLN
10.0000 mg | Freq: Once | INTRAMUSCULAR | Status: AC
  Administered 2018-12-10: 10 mg via INTRAVENOUS

## 2018-12-10 MED ORDER — SODIUM CHLORIDE 0.9 % IV SOLN
INTRAVENOUS | Status: DC
  Administered 2018-12-10 – 2018-12-11 (×3): via INTRAVENOUS

## 2018-12-10 MED ORDER — METOCLOPRAMIDE HCL 5 MG/ML IJ SOLN: 5.0000 mg | Freq: Three times a day (TID) | INTRAMUSCULAR | Status: DC | PRN

## 2018-12-10 MED ORDER — ONDANSETRON HCL 4 MG PO TABS: 4.0000 mg | ORAL_TABLET | Freq: Four times a day (QID) | ORAL | Status: DC | PRN

## 2018-12-10 MED ORDER — SODIUM CHLORIDE 0.9 % IR SOLN
Status: DC | PRN
  Administered 2018-12-10: 1000 mL

## 2018-12-10 MED ORDER — FERROUS SULFATE 325 (65 FE) MG PO TABS: 325.0000 mg | ORAL_TABLET | Freq: Three times a day (TID) | ORAL | 3 refills | Status: DC

## 2018-12-10 MED ORDER — TRANEXAMIC ACID-NACL 1000-0.7 MG/100ML-% IV SOLN
1000.0000 mg | INTRAVENOUS | Status: AC
  Administered 2018-12-10: 1000 mg via INTRAVENOUS
  Filled 2018-12-10: qty 100

## 2018-12-10 MED ORDER — ALUM & MAG HYDROXIDE-SIMETH 200-200-20 MG/5ML PO SUSP: 15.0000 mL | ORAL | Status: DC | PRN

## 2018-12-10 SURGICAL SUPPLY — 54 items
ADH SKN CLS APL DERMABOND .7 (GAUZE/BANDAGES/DRESSINGS) ×1
BAG DECANTER FOR FLEXI CONT (MISCELLANEOUS) IMPLANT
BAG SPEC THK2 15X12 ZIP CLS (MISCELLANEOUS)
BAG ZIPLOCK 12X15 (MISCELLANEOUS) IMPLANT
BLADE SAG 18X100X1.27 (BLADE) ×3 IMPLANT
BLADE SURG SZ10 CARB STEEL (BLADE) ×6 IMPLANT
COVER PERINEAL POST (MISCELLANEOUS) ×3 IMPLANT
COVER SURGICAL LIGHT HANDLE (MISCELLANEOUS) ×3 IMPLANT
COVER WAND RF STERILE (DRAPES) ×3 IMPLANT
CUP ACET PINNACLE SECTR 56MM (Hips) ×1 IMPLANT
DERMABOND ADVANCED (GAUZE/BANDAGES/DRESSINGS) ×2
DERMABOND ADVANCED .7 DNX12 (GAUZE/BANDAGES/DRESSINGS) ×1 IMPLANT
DRAPE STERI IOBAN 125X83 (DRAPES) ×3 IMPLANT
DRAPE U-SHAPE 47X51 STRL (DRAPES) ×6 IMPLANT
DRESSING AQUACEL AG SP 3.5X10 (GAUZE/BANDAGES/DRESSINGS) ×1 IMPLANT
DRSG AQUACEL AG SP 3.5X10 (GAUZE/BANDAGES/DRESSINGS) ×3
DURAPREP 26ML APPLICATOR (WOUND CARE) ×3 IMPLANT
ELECT REM PT RETURN 15FT ADLT (MISCELLANEOUS) ×3 IMPLANT
ELIMINATOR HOLE APEX DEPUY (Hips) ×3 IMPLANT
GLOVE BIO SURGEON STRL SZ 6.5 (GLOVE) ×2 IMPLANT
GLOVE BIO SURGEONS STRL SZ 6.5 (GLOVE) ×1
GLOVE BIOGEL M STRL SZ7.5 (GLOVE) IMPLANT
GLOVE BIOGEL PI IND STRL 6.5 (GLOVE) ×2 IMPLANT
GLOVE BIOGEL PI IND STRL 7.0 (GLOVE) ×2 IMPLANT
GLOVE BIOGEL PI IND STRL 7.5 (GLOVE) ×2 IMPLANT
GLOVE BIOGEL PI IND STRL 8.5 (GLOVE) IMPLANT
GLOVE BIOGEL PI INDICATOR 6.5 (GLOVE) ×4
GLOVE BIOGEL PI INDICATOR 7.0 (GLOVE) ×4
GLOVE BIOGEL PI INDICATOR 7.5 (GLOVE) ×4
GLOVE BIOGEL PI INDICATOR 8.5 (GLOVE)
GLOVE ECLIPSE 8.0 STRL XLNG CF (GLOVE) ×3 IMPLANT
GLOVE ORTHO TXT STRL SZ7.5 (GLOVE) ×3 IMPLANT
GLOVE SURG SS PI 6.5 STRL IVOR (GLOVE) ×3 IMPLANT
GLOVE SURG SS PI 7.5 STRL IVOR (GLOVE) ×3 IMPLANT
GOWN SPEC L4 XLG W/TWL (GOWN DISPOSABLE) ×3 IMPLANT
GOWN STRL REIN 2XL XLG LVL4 (GOWN DISPOSABLE) ×3 IMPLANT
GOWN STRL REUS W/TWL 2XL LVL3 (GOWN DISPOSABLE) IMPLANT
GOWN STRL REUS W/TWL LRG LVL3 (GOWN DISPOSABLE) ×6 IMPLANT
HEAD CERAMIC DELTA 36 PLUS 1.5 (Hips) ×3 IMPLANT
HOLDER FOLEY CATH W/STRAP (MISCELLANEOUS) ×3 IMPLANT
PACK ANTERIOR HIP CUSTOM (KITS) ×3 IMPLANT
PINNACLE ALTRX PLUS 4 N 36X56 (Hips) ×3 IMPLANT
PINNACLE SECTOR CUP 56MM (Hips) ×3 IMPLANT
SCREW 6.5MMX30MM (Screw) ×3 IMPLANT
STEM FEMORAL SZ6 HIGH ACTIS (Stem) ×3 IMPLANT
SUT MNCRL AB 4-0 PS2 18 (SUTURE) ×3 IMPLANT
SUT STRATAFIX 0 PDS 27 VIOLET (SUTURE) ×3
SUT VIC AB 1 CT1 36 (SUTURE) ×9 IMPLANT
SUT VIC AB 2-0 CT1 27 (SUTURE) ×6
SUT VIC AB 2-0 CT1 TAPERPNT 27 (SUTURE) ×2 IMPLANT
SUTURE STRATFX 0 PDS 27 VIOLET (SUTURE) ×1 IMPLANT
TRAY FOLEY MTR SLVR 16FR STAT (SET/KITS/TRAYS/PACK) ×3 IMPLANT
WATER STERILE IRR 1000ML POUR (IV SOLUTION) ×3 IMPLANT
YANKAUER SUCT BULB TIP 10FT TU (MISCELLANEOUS) ×3 IMPLANT

## 2018-12-10 NOTE — Anesthesia Procedure Notes (Signed)
Spinal  Patient location during procedure: OR Start time: 12/10/2018 7:23 AM End time: 12/10/2018 7:28 AM Staffing Anesthesiologist: Lynda Rainwater, MD Performed: anesthesiologist  Preanesthetic Checklist Completed: patient identified, site marked, surgical consent, pre-op evaluation, timeout performed, IV checked, risks and benefits discussed and monitors and equipment checked Spinal Block Patient position: sitting Prep: ChloraPrep Patient monitoring: heart rate, continuous pulse ox and blood pressure Approach: midline Location: L3-4 Injection technique: single-shot Needle Needle type: Pencan  Needle gauge: 24 G Needle length: 9 cm

## 2018-12-10 NOTE — Discharge Instructions (Signed)

## 2018-12-10 NOTE — Evaluation (Signed)
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 Pt currently with functional limitations due to the deficits listed below (see PT Problem List). Pt will benefit from skilled PT to increase their independence and safety with mobility to allow discharge to the venue listed below.  Pt assisted with ambulating in hallway POD #0 and plans to d/c home.  Pt motivated and has hx of left and right TKAs.  Pt anticipates d/c home tomorrow.     Follow Up Recommendations No PT follow up    Equipment Recommendations  None recommended by PT    Recommendations for Other Services       Precautions / Restrictions Precautions Precautions: None Restrictions Other Position/Activity Restrictions: WBAT      Mobility  Bed Mobility Overal bed mobility: Needs Assistance Bed Mobility: Supine to Sit     Supine to sit: Min guard;HOB elevated     General bed mobility comments: verbal cues for safe technique  Transfers Overall transfer level: Needs assistance Equipment used: Rolling walker (2 wheeled) Transfers: Sit to/from Stand Sit to Stand: Min guard         General transfer comment: verbal cues for UE and LE positioning  Ambulation/Gait Ambulation/Gait assistance: Min guard Gait Distance (Feet): 120 Feet Assistive device: Rolling walker (2 wheeled) Gait Pattern/deviations: Step-through pattern;Decreased stance time - right     General Gait Details: pt progressed to step through pattern, cues for RW positioning  Stairs            Wheelchair Mobility    Modified Rankin (Stroke Patients Only)       Balance                                             Pertinent Vitals/Pain Pain Assessment: 0-10 Pain Score: 2  Pain  Location: R hip Pain Descriptors / Indicators: Aching;Sore Pain Intervention(s): Limited activity within patient's tolerance;Monitored during session;Premedicated before session;Repositioned;Ice applied    Home Living Family/patient expects to be discharged to:: Private residence Living Arrangements: Spouse/significant other Available Help at Discharge: Family Type of Home: House Home Access: Stairs to enter Entrance Stairs-Rails: None Entrance Stairs-Number of Steps: 2 Home Layout: One level Home Equipment: Environmental consultant - 2 wheels;Bedside commode;Cane - single point      Prior Function Level of Independence: Independent               Hand Dominance        Extremity/Trunk Assessment        Lower Extremity Assessment Lower Extremity Assessment: RLE deficits/detail RLE Deficits / Details: anticipated post op hip weakness observed, pt able to perform quad sets and ankle pumps       Communication   Communication: No difficulties  Cognition Arousal/Alertness: Awake/alert Behavior During Therapy: WFL for tasks assessed/performed Overall Cognitive Status: Within Functional Limits for tasks assessed                                        General Comments      Exercises  Assessment/Plan    PT Assessment Patient needs continued PT services  PT Problem List Decreased strength;Decreased mobility;Pain       PT Treatment Interventions Stair training;Therapeutic exercise;Neuromuscular re-education;Therapeutic activities;Gait training;Patient/family education;Functional mobility training;DME instruction    PT Goals (Current goals can be found in the Care Plan section)  Acute Rehab PT Goals PT Goal Formulation: With patient Time For Goal Achievement: 12/14/18 Potential to Achieve Goals: Good    Frequency 7X/week   Barriers to discharge        Co-evaluation               AM-PAC PT "6 Clicks" Mobility  Outcome Measure Help needed turning  from your back to your side while in a flat bed without using bedrails?: None Help needed moving from lying on your back to sitting on the side of a flat bed without using bedrails?: A Little Help needed moving to and from a bed to a chair (including a wheelchair)?: A Little Help needed standing up from a chair using your arms (e.g., wheelchair or bedside chair)?: A Little Help needed to walk in hospital room?: A Little Help needed climbing 3-5 steps with a railing? : A Little 6 Click Score: 19    End of Session Equipment Utilized During Treatment: Gait belt Activity Tolerance: Patient tolerated treatment well Patient left: in chair;with call bell/phone within reach   PT Visit Diagnosis: Other abnormalities of gait and mobility (R26.89)    Time: 7510-2585 PT Time Calculation (min) (ACUTE ONLY): 23 min   Charges:   PT Evaluation $PT Eval Low Complexity: Ashley, PT, DPT Acute Rehabilitation Services Office: [phone] Pager: [phone]  Trena Platt 12/10/2018, 3:50 PM

## 2018-12-10 NOTE — Op Note (Signed)
NAME:  Corey Clarke                ACCOUNT NO.: [id number]      MEDICAL RECORD NO.: [id number]      FACILITY:  [facility]      PHYSICIAN:  Mauri Pole  DATE OF BIRTH:  06/20/1956     DATE OF PROCEDURE:  12/10/2018                                 OPERATIVE REPORT         PREOPERATIVE DIAGNOSIS: Right  hip osteoarthritis.      POSTOPERATIVE DIAGNOSIS:  Right hip osteoarthritis.      PROCEDURE:  Right total hip replacement through an anterior approach   utilizing DePuy THR system, component size 63mm pinnacle cup, a size 36+4 neutral   Altrex liner, a size 6 Hi Actis femoral stem with a 36+1.5 delta ceramic   ball.      SURGEON:  Pietro Cassis. Alvan Dame, M.D.      ASSISTANT:  Griffith Citron, PA-C     ANESTHESIA:  Spinal.      SPECIMENS:  None.      COMPLICATIONS:  None.      BLOOD LOSS:  650 cc     DRAINS:  None.      INDICATION OF THE PROCEDURE:  Corey Clarke is a 63 y.o. male who had   presented to office for evaluation of right hip pain.  Radiographs revealed   progressive degenerative changes with bone-on-bone   articulation of the  hip joint, including subchondral cystic changes and osteophytes.  The patient had painful limited range of   motion significantly affecting their overall quality of life and function.  The patient was failing to    respond to conservative measures including medications and/or injections and activity modification and at this point was ready   to proceed with more definitive measures.  Consent was obtained for   benefit of pain relief.  Specific risks of infection, DVT, component   failure, dislocation, neurovascular injury, and need for revision surgery were reviewed in the office as well discussion of   the anterior versus posterior approach were reviewed.     PROCEDURE IN DETAIL:  The patient was brought to operative theater.   Once adequate anesthesia, preoperative antibiotics, 2 gm of Ancef, 1 gm of Vancomycin, 1 gm  of Tranexamic Acid, and 10 mg of Decadron were administered, the patient was positioned supine on the [employer] table.  Once the patient was safely positioned with adequate padding of boney prominences we predraped out the hip, and used fluoroscopy to confirm orientation of the pelvis.      The right hip was then prepped and draped from proximal iliac crest to   mid thigh with a shower curtain technique.      Time-out was performed identifying the patient, planned procedure, and the appropriate extremity.     An incision was then made 2 cm lateral to the   anterior superior iliac spine extending over the orientation of the   tensor fascia lata muscle and sharp dissection was carried down to the   fascia of the muscle.      The fascia was then incised.  The muscle belly was identified and swept   laterally and retractor placed along the superior neck.  Following   cauterization of the circumflex  vessels and removing some pericapsular   fat, a second cobra retractor was placed on the inferior neck.  A T-capsulotomy was made along the line of the   superior neck to the trochanteric fossa, then extended proximally and   distally.  Tag sutures were placed and the retractors were then placed   intracapsular.  We then identified the trochanteric fossa and   orientation of my neck cut and then made a neck osteotomy with the femur on traction.  The femoral   head was removed without difficulty or complication.  Traction was let   off and retractors were placed posterior and anterior around the   acetabulum.      The labrum and foveal tissue were debrided.  I began reaming with a 21mm mm   reamer and reamed up to 55 mm reamer with good bony bed preparation and a 56 mm  cup was chosen.  The final 56 mm Pinnacle cup was then impacted under fluoroscopy to confirm the depth of penetration and orientation with respect to   Abduction and forward flexion.  A screw was placed into the ilium followed by the  hole eliminator.  The final   36+4 neutral Altrex liner was impacted with good visualized rim fit.  The cup was positioned anatomically within the acetabular portion of the pelvis.      At this point, the femur was rolled to 100 degrees.  Further capsule was   released off the inferior aspect of the femoral neck.  I then   released the superior capsule proximally.  With the leg in a neutral position the hook was placed laterally   along the femur under the vastus lateralis origin and elevated manually and then held in position using the hook attachment on the bed.  The leg was then extended and adducted with the leg rolled to 100   degrees of external rotation.  Retractors were placed along the medial calcar and posteriorly over the greater trochanter.  Once the proximal femur was fully   exposed, I used a box osteotome to set orientation.  I then began   broaching with the starting chili pepper broach and passed this by hand and then broached up to 6.  With the 6 broach in place I chose a high offset neck and did several trial reductions.  The offset was appropriate, leg lengths   appeared to be equal best matched with the +1.5 head ball trial confirmed radiographically.   Given these findings, I went ahead and dislocated the hip, repositioned all   retractors and positioned the right hip in the extended and abducted position.  The final 6 Hi Actis femoral stem was   chosen and it was impacted down to the level of neck cut.  Based on this   and the trial reductions, a final 36+1.5 delta ceramic ball was chosen and   impacted onto a clean and dry trunnion, and the hip was reduced.  The   hip had been irrigated throughout the case again at this point.  I did   reapproximate the superior capsular leaflet to the anterior leaflet   using #1 Vicryl.  The fascia of the   tensor fascia lata muscle was then reapproximated using #1 Vicryl and #0 Stratafix sutures.  The   remaining wound was closed with  2-0 Vicryl and running 4-0 Monocryl.   The hip was cleaned, dried, and dressed sterilely using Dermabond and   Aquacel dressing.  The patient was  then brought   to recovery room in stable condition tolerating the procedure well.    Griffith Citron, PA-C was present for the entirety of the case involved from   preoperative positioning, perioperative retractor management, general   facilitation of the case, as well as primary wound closure as assistant.            Pietro Cassis Alvan Dame, M.D.        12/10/2018 8:44 AM

## 2018-12-10 NOTE — Interval H&P Note (Signed)
History and Physical Interval Note:  12/10/2018 7:06 AM  Corey Clarke  has presented today for surgery, with the diagnosis of Right hip osteoarthritis  The various methods of treatment have been discussed with the patient and family. After consideration of risks, benefits and other options for treatment, the patient has consented to  Procedure(s) with comments: TOTAL HIP ARTHROPLASTY ANTERIOR APPROACH (Right) - 70 mins as a surgical intervention .  The patient's history has been reviewed, patient examined, no change in status, stable for surgery.  I have reviewed the patient's chart and labs.  Questions were answered to the patient's satisfaction.     Mauri Pole

## 2018-12-10 NOTE — Transfer of Care (Signed)
Immediate Anesthesia Transfer of Care Note  Patient: Corey Clarke  Procedure(s) Performed: TOTAL HIP ARTHROPLASTY ANTERIOR APPROACH (Right Hip)  Patient Location: PACU  Anesthesia Type:Regional  Level of Consciousness: awake, alert  and oriented  Airway & Oxygen Therapy: Patient Spontanous Breathing and Patient connected to face mask oxygen  Post-op Assessment: Report given to RN and Post -op Vital signs reviewed and stable  Post vital signs: Reviewed and stable  Last Vitals:  Vitals Value Taken Time  BP 110/64 12/10/2018  9:22 AM  Temp    Pulse 83 12/10/2018  9:22 AM  Resp 11 12/10/2018  9:22 AM  SpO2 98 % 12/10/2018  9:22 AM  Vitals shown include unvalidated device data.  Last Pain:  Vitals:   12/10/18 0619  TempSrc: Oral  PainSc:          Complications: No apparent anesthesia complications

## 2018-12-10 NOTE — Anesthesia Postprocedure Evaluation (Signed)
Anesthesia Post Note  Patient: ELYJAH HAZAN  Procedure(s) Performed: TOTAL HIP ARTHROPLASTY ANTERIOR APPROACH (Right Hip)     Patient location during evaluation: PACU Anesthesia Type: Spinal Level of consciousness: oriented and awake and alert Pain management: pain level controlled Vital Signs Assessment: post-procedure vital signs reviewed and stable Respiratory status: spontaneous breathing and respiratory function stable Cardiovascular status: blood pressure returned to baseline and stable Postop Assessment: no headache, no backache and no apparent nausea or vomiting Anesthetic complications: no    Last Vitals:  Vitals:   12/10/18 0922 12/10/18 0930  BP: 110/64 113/63  Pulse: 83 77  Resp: 11 13  Temp: (!) 36.1 C   SpO2: 98% 99%    Last Pain:  Vitals:   12/10/18 1015  TempSrc:   PainSc: 0-No pain                 Lynda Rainwater

## 2018-12-10 NOTE — Anesthesia Preprocedure Evaluation (Signed)
Anesthesia Evaluation  Patient identified by MRN, date of birth, ID band Patient awake    Reviewed: Allergy & Precautions, H&P , NPO status , Patient's Chart, lab work & pertinent test results  History of Anesthesia Complications (+) PONV  Airway Mallampati: II  TM Distance: >3 FB Neck ROM: Full    Dental no notable dental hx. (+) Teeth Intact, Dental Advisory Given   Pulmonary neg pulmonary ROS,    Pulmonary exam normal breath sounds clear to auscultation       Cardiovascular Exercise Tolerance: Good negative cardio ROS   Rhythm:Regular Rate:Normal     Neuro/Psych negative neurological ROS  negative psych ROS   GI/Hepatic negative GI ROS, Neg liver ROS,   Endo/Other  negative endocrine ROS  Renal/GU negative Renal ROS  negative genitourinary   Musculoskeletal  (+) Arthritis , Osteoarthritis,    Abdominal   Peds  Hematology negative hematology ROS (+)   Anesthesia Other Findings   Reproductive/Obstetrics negative OB ROS                             Anesthesia Physical  Anesthesia Plan  ASA: II  Anesthesia Plan: Spinal   Post-op Pain Management:    Induction: Intravenous  PONV Risk Score and Plan: 2 and Ondansetron and Midazolam  Airway Management Planned: Simple Face Mask  Additional Equipment:   Intra-op Plan:   Post-operative Plan:   Informed Consent: I have reviewed the patients History and Physical, chart, labs and discussed the procedure including the risks, benefits and alternatives for the proposed anesthesia with the patient or authorized representative who has indicated his/her understanding and acceptance.     Dental advisory given  Plan Discussed with: CRNA  Anesthesia Plan Comments:         Anesthesia Quick Evaluation

## 2018-12-11 ENCOUNTER — Encounter (HOSPITAL_COMMUNITY): Payer: Self-pay | Admitting: Orthopedic Surgery

## 2018-12-11 DIAGNOSIS — E663 Overweight: Secondary | ICD-10-CM | POA: Diagnosis present

## 2018-12-11 LAB — CBC
HEMATOCRIT: 33.4 % — AB (ref 39.0–52.0)
Hemoglobin: 11.3 g/dL — ABNORMAL LOW (ref 13.0–17.0)
MCH: 32.6 pg (ref 26.0–34.0)
MCHC: 33.8 g/dL (ref 30.0–36.0)
MCV: 96.3 fL (ref 80.0–100.0)
Platelets: 223 10*3/uL (ref 150–400)
RBC: 3.47 MIL/uL — ABNORMAL LOW (ref 4.22–5.81)
RDW: 12.6 % (ref 11.5–15.5)
WBC: 14.9 10*3/uL — ABNORMAL HIGH (ref 4.0–10.5)
nRBC: 0 % (ref 0.0–0.2)

## 2018-12-11 LAB — BASIC METABOLIC PANEL
Anion gap: 9 (ref 5–15)
BUN: 15 mg/dL (ref 8–23)
CO2: 24 mmol/L (ref 22–32)
Calcium: 8.2 mg/dL — ABNORMAL LOW (ref 8.9–10.3)
Chloride: 105 mmol/L (ref 98–111)
Creatinine, Ser: 0.69 mg/dL (ref 0.61–1.24)
GFR calc Af Amer: >60 mL/min (ref >60)
GFR calc non Af Amer: >60 mL/min (ref >60)
Glucose, Bld: 137 mg/dL — ABNORMAL HIGH (ref 70–99)
Potassium: 4.2 mmol/L (ref 3.5–5.1)
Sodium: 138 mmol/L (ref 135–145)

## 2018-12-11 NOTE — Progress Notes (Signed)
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  Pt mobilizing well and performed standing exercises.  Pt provided with HEP handout and reviewed.  Pt reports no further questions and feels ready for d/c home today.   Follow Up Recommendations  No PT follow up     Equipment Recommendations  None recommended by PT    Recommendations for Other Services       Precautions / Restrictions Precautions Precautions: None Restrictions Other Position/Activity Restrictions: WBAT    Mobility  Bed Mobility Overal bed mobility: Needs Assistance Bed Mobility: Supine to Sit     Supine to sit: Supervision     General bed mobility comments: verbal cues for safe technique  Transfers Overall transfer level: Needs assistance Equipment used: Rolling walker (2 wheeled) Transfers: Sit to/from Stand Sit to Stand: Supervision         General transfer comment: verbal cues for UE and LE positioning  Ambulation/Gait Ambulation/Gait assistance: Min guard;Supervision Gait Distance (Feet): 200 Feet Assistive device: Rolling walker (2 wheeled) Gait Pattern/deviations: Step-through pattern;Decreased stance time - right     General Gait Details: cues for RW positioning and posture   Stairs Stairs: Yes Stairs assistance: Min guard Stair Management: Step to pattern;Backwards;With walker Number of Stairs: 3 General stair comments: verbal cues for safety and sequence, pt recalls from previous TKAs, performed well and had no questions   Wheelchair Mobility    Modified Rankin (Stroke Patients Only)       Balance                                            Cognition Arousal/Alertness: Awake/alert Behavior  During Therapy: WFL for tasks assessed/performed Overall Cognitive Status: Within Functional Limits for tasks assessed                                        Exercises Total Joint Exercises Ankle Circles/Pumps: AROM;10 reps;Both Quad Sets: AROM;10 reps;Both Heel Slides: AAROM;10 reps;Right Hip ABduction/ADduction: AROM;10 reps;Standing;Supine;Right Long Arc Quad: AROM;10 reps;Seated;Right Knee Flexion: AROM;10 reps;Standing;Right Marching in Standing: AROM;10 reps;Standing;Right Standing Hip Extension: AROM;10 reps;Standing;Right    General Comments        Pertinent Vitals/Pain Pain Assessment: 0-10 Pain Score: 4  Pain Location: R hip Pain Descriptors / Indicators: Aching;Sore Pain Intervention(s): Limited activity within patient's tolerance;Premedicated before session;Monitored during session;Repositioned;Ice applied    Home Living                      Prior Function            PT Goals (current goals can now be found in the care plan section) Progress towards PT goals: Progressing toward goals    Frequency    7X/week      PT Plan Current plan remains appropriate    Co-evaluation              AM-PAC PT "6 Clicks" Mobility   Outcome Measure  Help needed turning from  your back to your side while in a flat bed without using bedrails?: None Help needed moving from lying on your back to sitting on the side of a flat bed without using bedrails?: None Help needed moving to and from a bed to a chair (including a wheelchair)?: A Little Help needed standing up from a chair using your arms (e.g., wheelchair or bedside chair)?: A Little Help needed to walk in hospital room?: A Little Help needed climbing 3-5 steps with a railing? : A Little 6 Click Score: 20    End of Session   Activity Tolerance: Patient tolerated treatment well Patient left: in chair;with call bell/phone within reach;with family/visitor present Nurse Communication:  Mobility status PT Visit Diagnosis: Other abnormalities of gait and mobility (R26.89)     Time: 8250-0370 PT Time Calculation (min) (ACUTE ONLY): 24 min  Charges:  $Gait Training: 8-22 mins $Therapeutic Exercise: 8-22 mins                    Carmelia Bake, PT, DPT Acute Rehabilitation Services Office: [phone] Pager: [place] 12/11/2018, 2:12 PM

## 2018-12-11 NOTE — Plan of Care (Signed)
  Problem: Clinical Measurements: Goal: Will remain free from infection Outcome: Progressing Goal: Respiratory complications will improve Outcome: Progressing   Problem: Pain Managment: Goal: General experience of comfort will improve Outcome: Progressing   Problem: Safety: Goal: Ability to remain free from injury will improve Outcome: Progressing   

## 2018-12-11 NOTE — Progress Notes (Signed)
     Subjective: 1 Day Post-Op Procedure(s) (LRB): TOTAL HIP ARTHROPLASTY ANTERIOR APPROACH (Right)   Patient reports pain as mild, pain controlled. No events throughout the night. Feels that he is doing well and worked well with PT yesterday.  Dr. Alvan Dame discussed the procedure and the post-op expectations. Ready to be discharged home.    Objective:   VITALS:   Vitals:   12/11/18 0044 12/11/18 0402  BP: 108/66 103/61  Pulse: 76 72  Resp: 18 17  Temp: 98.6 F (37 C) 97.8 F (36.6 C)  SpO2: 93% 95%    Dorsiflexion/Plantar flexion intact Incision: dressing C/D/I No cellulitis present Compartment soft  LABS Recent Labs    12/11/18 0355  HGB 11.3*  HCT 33.4*  WBC 14.9*  PLT 223    Recent Labs    12/11/18 0355  NA 138  K 4.2  BUN 15  CREATININE 0.69  GLUCOSE 137*     Assessment/Plan: 1 Day Post-Op Procedure(s) (LRB): TOTAL HIP ARTHROPLASTY ANTERIOR APPROACH (Right) Foley cath d/c'ed Advance diet Up with therapy D/C IV fluids Discharge home Follow up in 2 weeks at [facility] Follow up with OLIN,Tehran Rabenold D in 2 weeks.  Contact information:  [facility]) [address] [place] [phone]    Overweight (BMI 25-29.9) Estimated body mass index is 28.59 kg/m as calculated from the following:   Height as of this encounter: 5\' 9"  (1.753 m).   Weight as of this encounter: 87.8 kg. Patient also counseled that weight may inhibit the healing process Patient counseled that losing weight will help with future health issues     West Pugh. Victoire Deans   PAC  12/11/2018, 8:28 AM

## 2018-12-13 ENCOUNTER — Ambulatory Visit (HOSPITAL_COMMUNITY): Admit: 2018-12-13 | Payer: 59 | Admitting: Internal Medicine

## 2018-12-13 ENCOUNTER — Encounter (HOSPITAL_COMMUNITY): Payer: Self-pay

## 2018-12-13 SURGERY — COLONOSCOPY WITH PROPOFOL
Anesthesia: Monitor Anesthesia Care

## 2018-12-17 NOTE — Discharge Summary (Signed)
Physician Discharge Summary  Patient ID: Corey Clarke MRN: [id number] DOB/AGE: 05/18/1956 63 y.o.  Admit date: 12/10/2018 Discharge date: 12/11/2018   Procedures:  Procedure(s) (LRB): TOTAL HIP ARTHROPLASTY ANTERIOR APPROACH (Right)  Attending Physician:  Dr. Paralee Cancel   Admission Diagnoses:   Right hip primary OA / pain  Discharge Diagnoses:  Principal Problem:   S/P right THA, AA Active Problems:   Overweight (BMI 25.0-29.9)  Past Medical History:  Diagnosis Date  . Arthritis   . Complication of anesthesia   . Elevated cholesterol   . PONV (postoperative nausea and vomiting)    and abd pain  . S/P knee replacement    patient reports after left knee replacement  01-10-18 he went home and 3 days later began having fevers  repors he had to be prscribed oral antbiotics fot a "blood infection"     HPI:    Corey Clarke, 63 y.o. male, has a history of pain and functional disability in the right hip(s) due to arthritis and patient has failed non-surgical conservative treatments for greater than 12 weeks to include NSAID's and/or analgesics, use of assistive devices and activity modification.  Onset of symptoms was gradual starting 2+ years ago with gradually worsening course since that time.The patient noted no past surgery on the right hip(s).  Patient currently rates pain in the right hip at 7 out of 10 with activity. Patient has night pain, worsening of pain with activity and weight bearing, trendelenberg gait, pain that interfers with activities of daily living and pain with passive range of motion. Patient has evidence of periarticular osteophytes and joint space narrowing by imaging studies. This condition presents safety issues increasing the risk of falls. There is no current active infection.  Risks, benefits and expectations were discussed with the patient.  Risks including but not limited to the risk of anesthesia, blood clots, nerve damage, blood vessel damage,  failure of the prosthesis, infection and up to and including death.  Patient understand the risks, benefits and expectations and wishes to proceed with surgery.   PCP: Glenda Chroman, MD   Discharged Condition: good  Hospital Course:  Patient underwent the above stated procedure on 12/10/2018. Patient tolerated the procedure well and brought to the recovery room in good condition and subsequently to the floor.  POD #1 BP: 103/61 ; Pulse: 72 ; Temp: 97.8 F (36.6 C) ; Resp: 17 Patient reports pain as mild, pain controlled. No events throughout the night. Feels that he is doing well and worked well with PT yesterday.  Dr. Alvan Dame discussed the procedure and the post-op expectations. Ready to be discharged home. Dorsiflexion/plantar flexion intact, incision: dressing C/D/I, no cellulitis present and compartment soft.   LABS  Basename    HGB     11.3  HCT     33.4    Discharge Exam: General appearance: alert, cooperative and no distress Extremities: Homans sign is negative, no sign of DVT, no edema, redness or tenderness in the calves or thighs and no ulcers, gangrene or trophic changes  Disposition:  Home with follow up in 2 weeks   Follow-up Information    Paralee Cancel, MD. Schedule an appointment as soon as possible for a visit in 2 weeks.   Specialty:  Orthopedic Surgery Contact information: [address] [place] [address] [phone]           Discharge Instructions    Call MD / Call 911   Complete by:  As  directed    If you experience chest pain or shortness of breath, CALL 911 and be transported to the hospital emergency room.  If you develope a fever above 101 F, pus (white drainage) or increased drainage or redness at the wound, or calf pain, call your surgeon's office.   Change dressing   Complete by:  As directed    Maintain surgical dressing until follow up in the clinic. If the edges start to pull up, may reinforce with tape. If the dressing is  no longer working, may remove and cover with gauze and tape, but must keep the area dry and clean.  Call with any questions or concerns.   Constipation Prevention   Complete by:  As directed    Drink plenty of fluids.  Prune juice may be helpful.  You may use a stool softener, such as Colace (over the counter) 100 mg twice a day.  Use MiraLax (over the counter) for constipation as needed.   Diet - low sodium heart healthy   Complete by:  As directed    Discharge instructions   Complete by:  As directed    Maintain surgical dressing until follow up in the clinic. If the edges start to pull up, may reinforce with tape. If the dressing is no longer working, may remove and cover with gauze and tape, but must keep the area dry and clean.  Follow up in 2 weeks at [facility]. Call with any questions or concerns.   Increase activity slowly as tolerated   Complete by:  As directed    Weight bearing as tolerated with assist device (walker, cane, etc) as directed, use it as long as suggested by your surgeon or therapist, typically at least 4-6 weeks.   TED hose   Complete by:  As directed    Use stockings (TED hose) for 2 weeks on both leg(s).  You may remove them at night for sleeping.      Allergies as of 12/11/2018      Reactions   Ciprofloxacin Hives   Other Nausea And Vomiting, Other (See Comments)   12-14-2016 anesthetics during spinal fusion procedure  cause nausea/vomiting      Medication List    STOP taking these medications   acetaminophen 650 MG CR tablet Commonly known as:  TYLENOL Replaced by:  acetaminophen 500 MG tablet   diclofenac 75 MG EC tablet Commonly known as:  VOLTAREN   oxyCODONE-acetaminophen 5-325 MG tablet Commonly known as:  PERCOCET/ROXICET   tiZANidine 4 MG tablet Commonly known as:  ZANAFLEX     TAKE these medications   acetaminophen 500 MG tablet Commonly known as:  TYLENOL Take 2 tablets (1,000 mg total) by mouth every 8 (eight)  hours. Replaces:  acetaminophen 650 MG CR tablet   aspirin 81 MG chewable tablet Commonly known as:  ASPIRIN CHILDRENS Chew 1 tablet (81 mg total) by mouth 2 (two) times daily for 30 days. Take for 4 weeks, then resume regular dose.   atorvastatin 10 MG tablet Commonly known as:  LIPITOR Take 10 mg by mouth at bedtime.   docusate sodium 100 MG capsule Commonly known as:  COLACE Take 1 capsule (100 mg total) by mouth 2 (two) times daily.   escitalopram 10 MG tablet Commonly known as:  LEXAPRO Take 10 mg by mouth at bedtime.   ferrous sulfate 325 (65 FE) MG tablet Commonly known as:  FERROUSUL Take 1 tablet (325 mg total) by mouth 3 (three) times daily with  meals.   Fish Oil 1000 MG Caps Take 2,000 mg by mouth daily.   methocarbamol 500 MG tablet Commonly known as:  ROBAXIN Take 1 tablet (500 mg total) by mouth every 6 (six) hours as needed for muscle spasms.   MULTIVITAMIN MEN 50+ PO Take 1 tablet by mouth daily.   NASACORT ALLERGY 24HR NA Place 2 sprays into both nostrils daily as needed (for congestion).   oxyCODONE 5 MG immediate release tablet Commonly known as:  Oxy IR/ROXICODONE Take 1-3 tablets (5-15 mg total) by mouth every 4 (four) hours as needed for moderate pain or severe pain.   polyethylene glycol packet Commonly known as:  MIRALAX / GLYCOLAX Take 17 g by mouth 2 (two) times daily.            Discharge Care Instructions  (From admission, onward)         Start     Ordered   12/11/18 0000  Change dressing    Comments:  Maintain surgical dressing until follow up in the clinic. If the edges start to pull up, may reinforce with tape. If the dressing is no longer working, may remove and cover with gauze and tape, but must keep the area dry and clean.  Call with any questions or concerns.   12/11/18 0831           Signed: West Pugh. Arnie Maiolo   PA-C  12/17/2018, 10:16 AM

## 2019-05-19 ENCOUNTER — Encounter (INDEPENDENT_AMBULATORY_CARE_PROVIDER_SITE_OTHER): Payer: Self-pay | Admitting: *Deleted

## 2019-05-19 ENCOUNTER — Other Ambulatory Visit (INDEPENDENT_AMBULATORY_CARE_PROVIDER_SITE_OTHER): Payer: Self-pay | Admitting: Internal Medicine

## 2019-05-19 ENCOUNTER — Telehealth (INDEPENDENT_AMBULATORY_CARE_PROVIDER_SITE_OTHER): Payer: Self-pay | Admitting: *Deleted

## 2019-05-19 DIAGNOSIS — Z1211 Encounter for screening for malignant neoplasm of colon: Secondary | ICD-10-CM

## 2019-05-19 MED ORDER — SUPREP BOWEL PREP KIT 17.5-3.13-1.6 GM/177ML PO SOLN: 1.0000 | Freq: Once | ORAL | 0 refills | Status: AC

## 2019-05-19 NOTE — Telephone Encounter (Signed)
Patient needs suprep 

## 2019-05-21 ENCOUNTER — Telehealth (INDEPENDENT_AMBULATORY_CARE_PROVIDER_SITE_OTHER): Payer: Self-pay | Admitting: *Deleted

## 2019-05-21 NOTE — Telephone Encounter (Signed)
Referring MD/PCP: vyas   Procedure: tcs w PROPOFOL (per NUR on 11/26/18)            Reason/Indication:  screening  Has patient had this procedure before?  Yes, 2009             If so, when, by whom and where?    Is there a family history of colon cancer?  no             Who?  What age when diagnosed?    Is patient diabetic?   no                                                  Does patient have prosthetic heart valve or mechanical valve?  no  Do you have a pacemaker?  no  Has patient ever had endocarditis? no  Has patient had joint replacement within last 12 months?  Yes, left hip 12/10/18  Is patient constipated or do they take laxatives? no  Does patient have a history of alcohol/drug use?  no  Is patient on blood thinner such as Coumadin, Plavix and/or Aspirin? no  Medications: diclofenac 75 mg bid, escitalopram 10 mg daily, atorvastatin 10 mg daily, tizanidine 4 mg daily, oxycodone 5 mg qid  Allergies: cipro  Medication Adjustment per Dr Lindi Adie, NP:   Procedure date & time: 06/20/19 at 815

## 2019-05-21 NOTE — Telephone Encounter (Signed)
agree

## 2019-06-17 ENCOUNTER — Other Ambulatory Visit (HOSPITAL_COMMUNITY): Payer: 59

## 2019-06-20 ENCOUNTER — Encounter (HOSPITAL_COMMUNITY): Payer: Self-pay

## 2019-06-20 ENCOUNTER — Ambulatory Visit (HOSPITAL_COMMUNITY): Admit: 2019-06-20 | Payer: 59 | Admitting: Internal Medicine

## 2019-06-20 SURGERY — COLONOSCOPY WITH PROPOFOL
Anesthesia: Monitor Anesthesia Care

## 2019-07-22 IMAGING — RF DG HIP (WITH PELVIS) OPERATIVE*R*
1 series · 2 of 2 positions shown · non-contrast
Comparison: 06/26/2018

CLINICAL DATA: 62-year-old male for right hip replacement. Initial
encounter.

EXAM:
OPERATIVE right HIP (WITH PELVIS IF PERFORMED) 2 VIEWS
TECHNIQUE: Fluoroscopic spot image(s) were submitted for interpretation
post-operatively.
Fluoroscopic time: 13 seconds.

[Series 1: unknown protocol · 0.20mm/px · 2 of 2 slices shown]
[im 1/2]
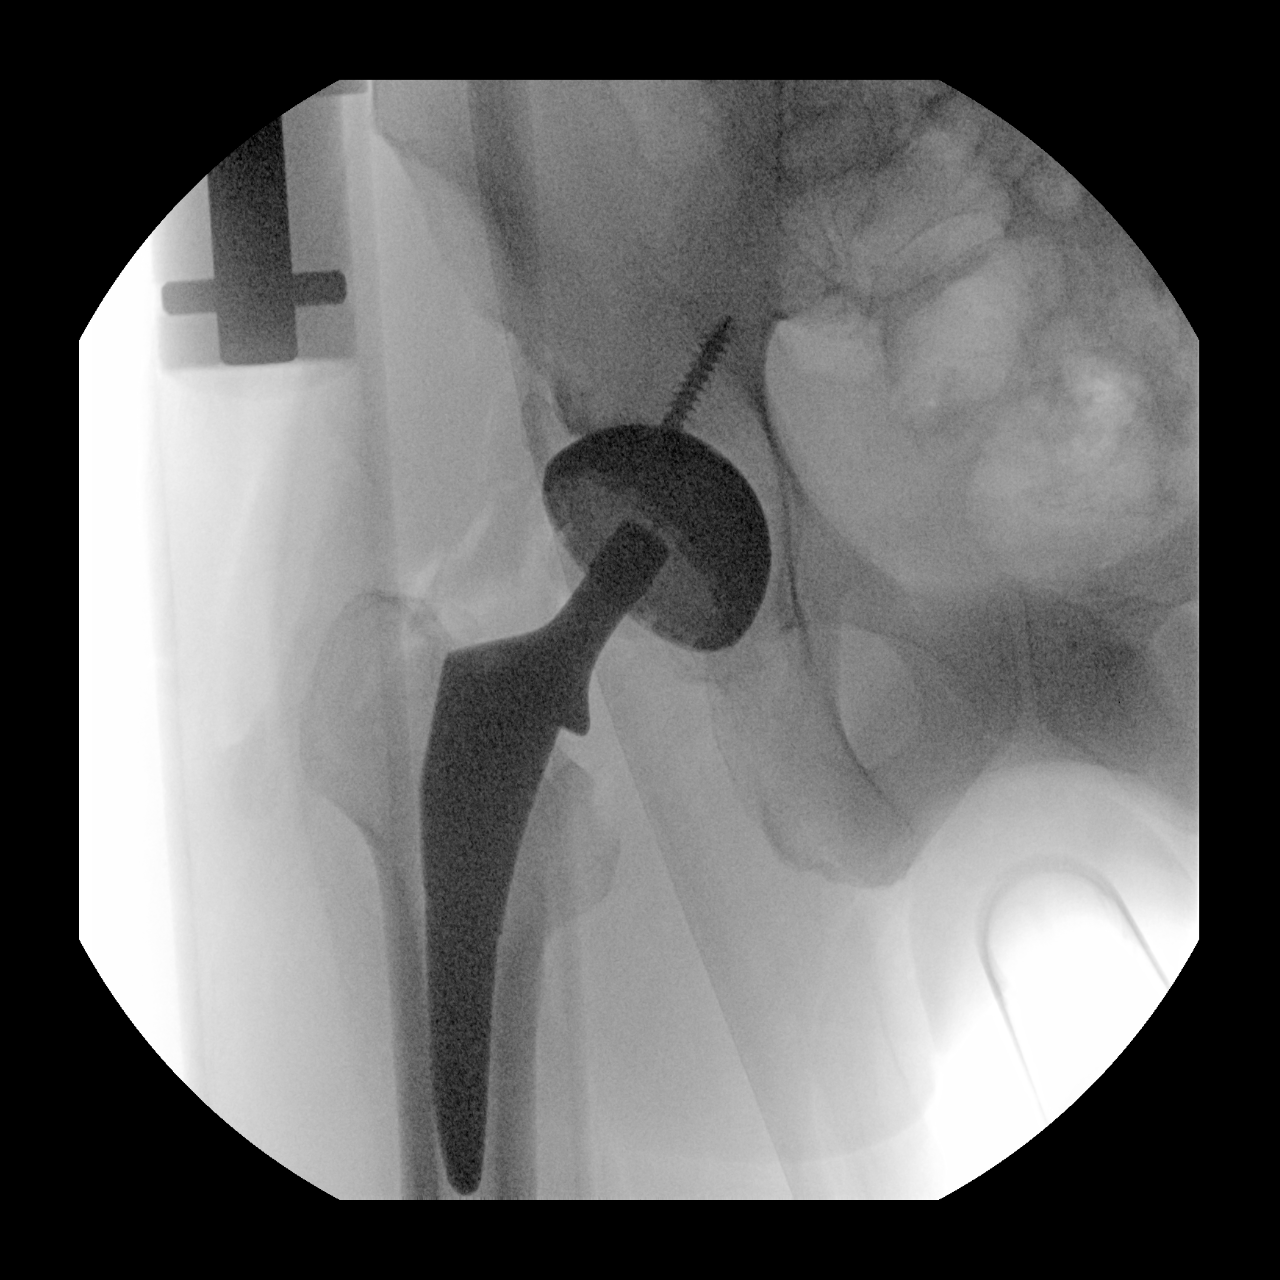
[im 2/2]
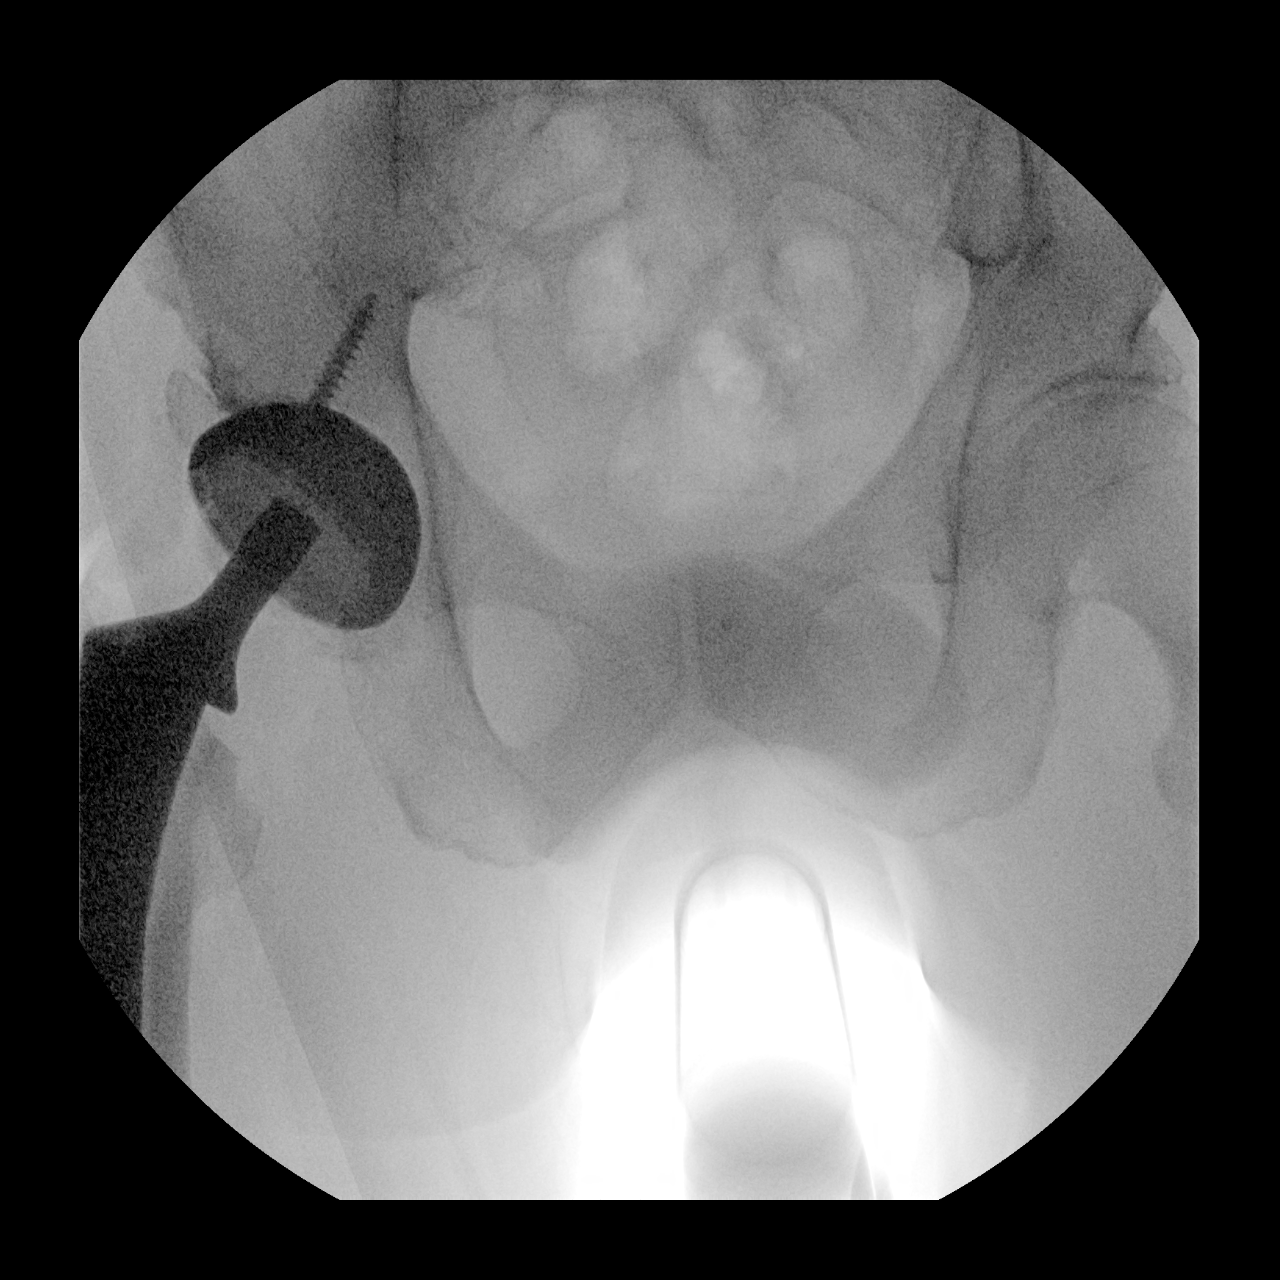

[2 of 2 positions shown; findings below may reference images not displayed]

FINDINGS: Post total right hip replacement. Portion visualized appears in
satisfactory position on frontal projection. This can be assessed on
follow-up.
IMPRESSION: Post total right hip replacement.

## 2019-07-22 IMAGING — DX DG PORTABLE PELVIS
1 series · 1 of 1 positions shown · non-contrast
Comparison: Intraoperative exam 12/10/2018.

CLINICAL DATA: 62-year-old male post right hip replacement.
Subsequent encounter.

EXAM:
PORTABLE PELVIS 1-2 VIEWS

[pelvis ap]
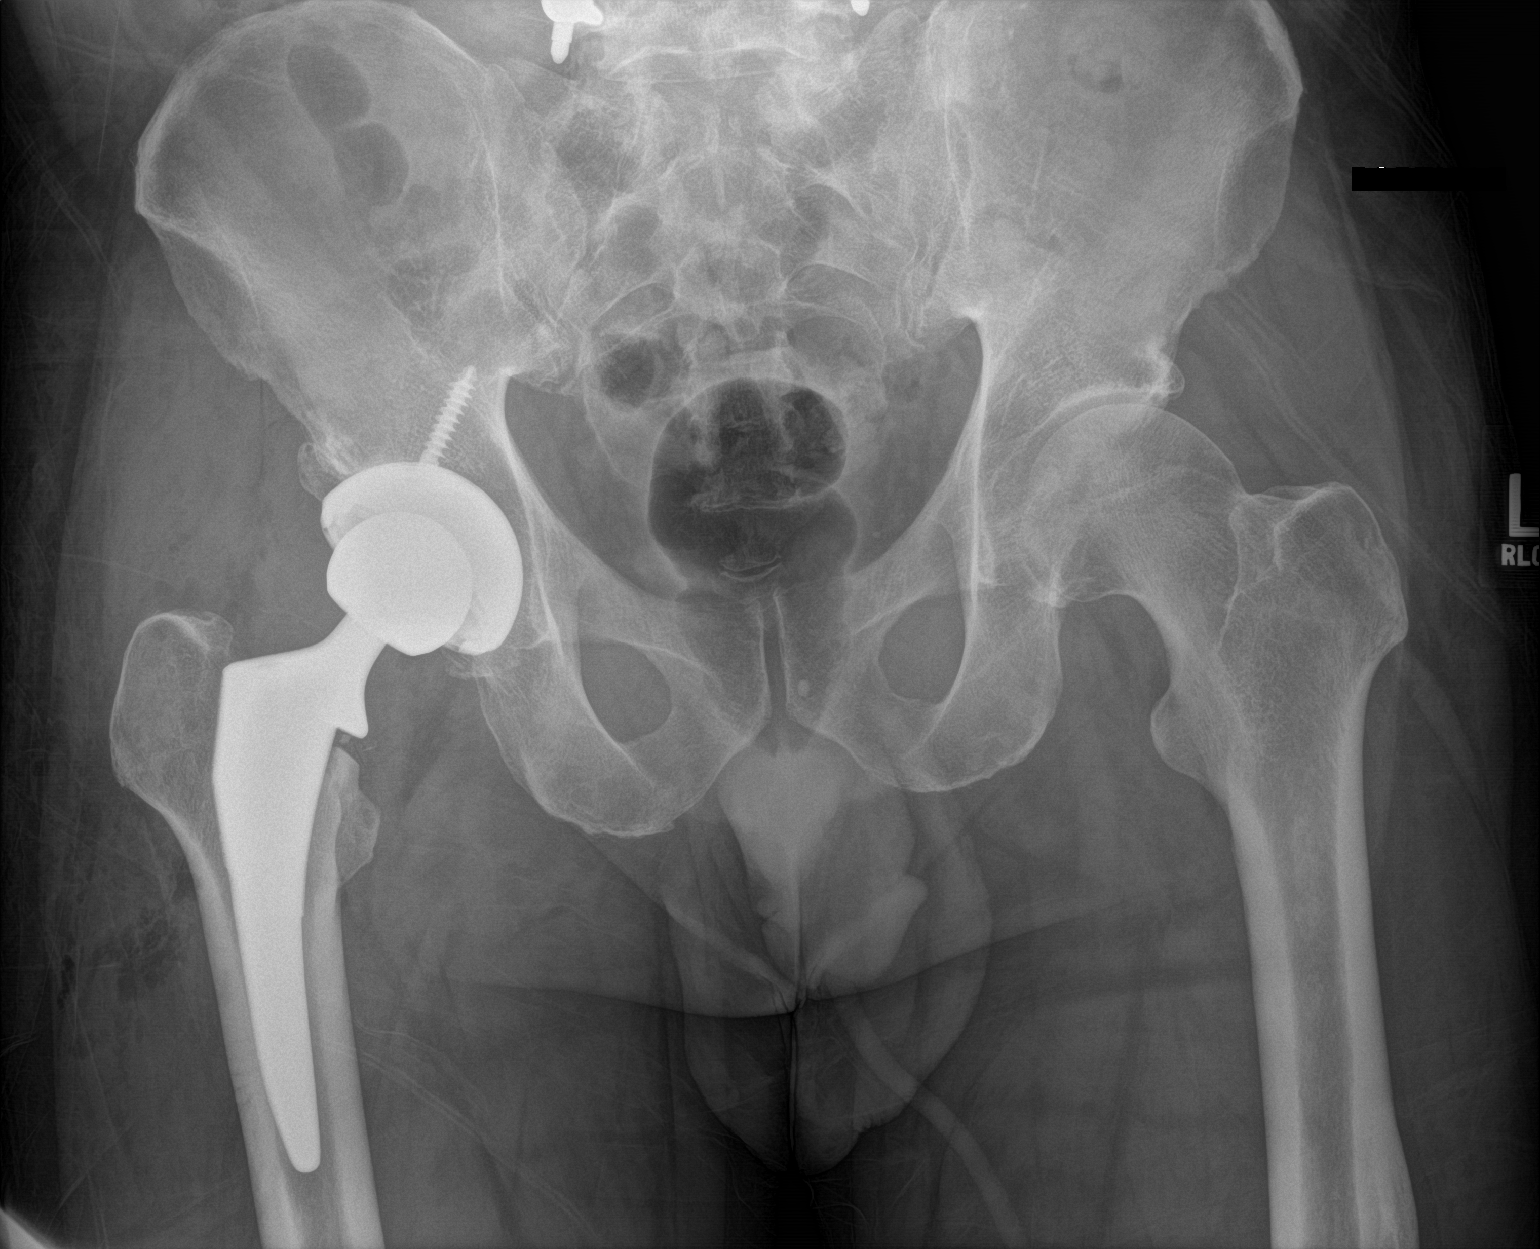

[1 of 1 positions shown; findings below may reference images not displayed]

FINDINGS: Post total right hip replacement which appears in satisfactory
position. Radiopaque material near calcar region may represent
expected findings. No surrounding fracture.

Mild left hip joint degenerative changes. Prior lower lumbar spine
surgery incompletely assessed.
IMPRESSION: Post total right hip replacement appears in satisfactory position.
Radiopaque material near calcar region may represent expected
findings. Clinical correlation recommended.

## 2019-12-23 DIAGNOSIS — M5442 Lumbago with sciatica, left side: Secondary | ICD-10-CM | POA: Diagnosis not present

## 2019-12-23 DIAGNOSIS — M961 Postlaminectomy syndrome, not elsewhere classified: Secondary | ICD-10-CM | POA: Diagnosis not present

## 2019-12-23 DIAGNOSIS — R030 Elevated blood-pressure reading, without diagnosis of hypertension: Secondary | ICD-10-CM | POA: Diagnosis not present

## 2019-12-23 DIAGNOSIS — G5603 Carpal tunnel syndrome, bilateral upper limbs: Secondary | ICD-10-CM | POA: Diagnosis not present

## 2020-03-10 DIAGNOSIS — M1990 Unspecified osteoarthritis, unspecified site: Secondary | ICD-10-CM | POA: Diagnosis not present

## 2020-03-10 DIAGNOSIS — G5760 Lesion of plantar nerve, unspecified lower limb: Secondary | ICD-10-CM | POA: Diagnosis not present

## 2020-03-10 DIAGNOSIS — Z299 Encounter for prophylactic measures, unspecified: Secondary | ICD-10-CM | POA: Diagnosis not present

## 2020-03-22 DIAGNOSIS — M5442 Lumbago with sciatica, left side: Secondary | ICD-10-CM | POA: Diagnosis not present

## 2020-03-22 DIAGNOSIS — M4802 Spinal stenosis, cervical region: Secondary | ICD-10-CM | POA: Diagnosis not present

## 2020-03-22 DIAGNOSIS — Z79899 Other long term (current) drug therapy: Secondary | ICD-10-CM | POA: Diagnosis not present

## 2020-03-22 DIAGNOSIS — M47812 Spondylosis without myelopathy or radiculopathy, cervical region: Secondary | ICD-10-CM | POA: Diagnosis not present

## 2020-06-17 DIAGNOSIS — J069 Acute upper respiratory infection, unspecified: Secondary | ICD-10-CM | POA: Diagnosis not present

## 2020-06-17 DIAGNOSIS — Z299 Encounter for prophylactic measures, unspecified: Secondary | ICD-10-CM | POA: Diagnosis not present

## 2020-06-17 DIAGNOSIS — M4802 Spinal stenosis, cervical region: Secondary | ICD-10-CM | POA: Diagnosis not present

## 2020-06-17 DIAGNOSIS — R5383 Other fatigue: Secondary | ICD-10-CM | POA: Diagnosis not present

## 2020-06-17 DIAGNOSIS — M48062 Spinal stenosis, lumbar region with neurogenic claudication: Secondary | ICD-10-CM | POA: Diagnosis not present

## 2020-06-17 DIAGNOSIS — M545 Low back pain: Secondary | ICD-10-CM | POA: Diagnosis not present

## 2020-06-17 DIAGNOSIS — M47812 Spondylosis without myelopathy or radiculopathy, cervical region: Secondary | ICD-10-CM | POA: Diagnosis not present

## 2020-06-17 DIAGNOSIS — M961 Postlaminectomy syndrome, not elsewhere classified: Secondary | ICD-10-CM | POA: Diagnosis not present

## 2020-07-12 DIAGNOSIS — M1990 Unspecified osteoarthritis, unspecified site: Secondary | ICD-10-CM | POA: Diagnosis not present

## 2020-07-12 DIAGNOSIS — Z299 Encounter for prophylactic measures, unspecified: Secondary | ICD-10-CM | POA: Diagnosis not present

## 2020-07-12 DIAGNOSIS — H6693 Otitis media, unspecified, bilateral: Secondary | ICD-10-CM | POA: Diagnosis not present

## 2020-08-31 DIAGNOSIS — I8390 Asymptomatic varicose veins of unspecified lower extremity: Secondary | ICD-10-CM | POA: Diagnosis not present

## 2020-08-31 DIAGNOSIS — M1712 Unilateral primary osteoarthritis, left knee: Secondary | ICD-10-CM | POA: Diagnosis not present

## 2020-08-31 DIAGNOSIS — Z299 Encounter for prophylactic measures, unspecified: Secondary | ICD-10-CM | POA: Diagnosis not present

## 2020-08-31 DIAGNOSIS — M25552 Pain in left hip: Secondary | ICD-10-CM | POA: Diagnosis not present

## 2020-09-16 DIAGNOSIS — M4802 Spinal stenosis, cervical region: Secondary | ICD-10-CM | POA: Diagnosis not present

## 2020-09-16 DIAGNOSIS — M7062 Trochanteric bursitis, left hip: Secondary | ICD-10-CM | POA: Diagnosis not present

## 2020-09-16 DIAGNOSIS — Z79899 Other long term (current) drug therapy: Secondary | ICD-10-CM | POA: Diagnosis not present

## 2020-09-16 DIAGNOSIS — M47812 Spondylosis without myelopathy or radiculopathy, cervical region: Secondary | ICD-10-CM | POA: Diagnosis not present

## 2020-09-29 DIAGNOSIS — Z789 Other specified health status: Secondary | ICD-10-CM | POA: Diagnosis not present

## 2020-09-29 DIAGNOSIS — M109 Gout, unspecified: Secondary | ICD-10-CM | POA: Diagnosis not present

## 2020-09-29 DIAGNOSIS — Z299 Encounter for prophylactic measures, unspecified: Secondary | ICD-10-CM | POA: Diagnosis not present

## 2020-09-29 DIAGNOSIS — J45909 Unspecified asthma, uncomplicated: Secondary | ICD-10-CM | POA: Diagnosis not present

## 2020-09-29 DIAGNOSIS — Z23 Encounter for immunization: Secondary | ICD-10-CM | POA: Diagnosis not present

## 2020-09-29 DIAGNOSIS — S6010XA Contusion of unspecified finger with damage to nail, initial encounter: Secondary | ICD-10-CM | POA: Diagnosis not present

## 2020-10-08 DIAGNOSIS — M7062 Trochanteric bursitis, left hip: Secondary | ICD-10-CM | POA: Diagnosis not present

## 2020-10-08 DIAGNOSIS — Z96641 Presence of right artificial hip joint: Secondary | ICD-10-CM | POA: Diagnosis not present

## 2020-10-29 DIAGNOSIS — R5383 Other fatigue: Secondary | ICD-10-CM | POA: Diagnosis not present

## 2020-10-29 DIAGNOSIS — E7800 Pure hypercholesterolemia, unspecified: Secondary | ICD-10-CM | POA: Diagnosis not present

## 2020-10-29 DIAGNOSIS — Z79899 Other long term (current) drug therapy: Secondary | ICD-10-CM | POA: Diagnosis not present

## 2020-12-03 DIAGNOSIS — Z789 Other specified health status: Secondary | ICD-10-CM | POA: Diagnosis not present

## 2020-12-03 DIAGNOSIS — Z299 Encounter for prophylactic measures, unspecified: Secondary | ICD-10-CM | POA: Diagnosis not present

## 2020-12-20 DIAGNOSIS — E7800 Pure hypercholesterolemia, unspecified: Secondary | ICD-10-CM | POA: Diagnosis not present

## 2020-12-20 DIAGNOSIS — M545 Low back pain, unspecified: Secondary | ICD-10-CM | POA: Diagnosis not present

## 2020-12-20 DIAGNOSIS — J069 Acute upper respiratory infection, unspecified: Secondary | ICD-10-CM | POA: Diagnosis not present

## 2020-12-20 DIAGNOSIS — Z299 Encounter for prophylactic measures, unspecified: Secondary | ICD-10-CM | POA: Diagnosis not present

## 2021-01-20 DIAGNOSIS — M4802 Spinal stenosis, cervical region: Secondary | ICD-10-CM | POA: Diagnosis not present

## 2021-01-20 DIAGNOSIS — M961 Postlaminectomy syndrome, not elsewhere classified: Secondary | ICD-10-CM | POA: Diagnosis not present

## 2021-01-20 DIAGNOSIS — R030 Elevated blood-pressure reading, without diagnosis of hypertension: Secondary | ICD-10-CM | POA: Diagnosis not present

## 2021-01-20 DIAGNOSIS — M47812 Spondylosis without myelopathy or radiculopathy, cervical region: Secondary | ICD-10-CM | POA: Diagnosis not present

## 2021-01-20 DIAGNOSIS — Z79899 Other long term (current) drug therapy: Secondary | ICD-10-CM | POA: Diagnosis not present

## 2021-01-20 DIAGNOSIS — M7062 Trochanteric bursitis, left hip: Secondary | ICD-10-CM | POA: Diagnosis not present

## 2021-01-20 DIAGNOSIS — M48062 Spinal stenosis, lumbar region with neurogenic claudication: Secondary | ICD-10-CM | POA: Diagnosis not present

## 2021-01-27 DIAGNOSIS — Z299 Encounter for prophylactic measures, unspecified: Secondary | ICD-10-CM | POA: Diagnosis not present

## 2021-01-27 DIAGNOSIS — E7800 Pure hypercholesterolemia, unspecified: Secondary | ICD-10-CM | POA: Diagnosis not present

## 2021-02-10 DIAGNOSIS — Z299 Encounter for prophylactic measures, unspecified: Secondary | ICD-10-CM | POA: Diagnosis not present

## 2021-02-10 DIAGNOSIS — M25552 Pain in left hip: Secondary | ICD-10-CM | POA: Diagnosis not present

## 2021-02-23 DIAGNOSIS — J45909 Unspecified asthma, uncomplicated: Secondary | ICD-10-CM | POA: Diagnosis not present

## 2021-02-23 DIAGNOSIS — Z299 Encounter for prophylactic measures, unspecified: Secondary | ICD-10-CM | POA: Diagnosis not present

## 2021-02-23 DIAGNOSIS — M25552 Pain in left hip: Secondary | ICD-10-CM | POA: Diagnosis not present

## 2021-03-09 DIAGNOSIS — M25552 Pain in left hip: Secondary | ICD-10-CM | POA: Diagnosis not present

## 2021-03-09 DIAGNOSIS — M5459 Other low back pain: Secondary | ICD-10-CM | POA: Diagnosis not present

## 2021-03-09 DIAGNOSIS — M5136 Other intervertebral disc degeneration, lumbar region: Secondary | ICD-10-CM | POA: Diagnosis not present

## 2021-03-19 DIAGNOSIS — E7800 Pure hypercholesterolemia, unspecified: Secondary | ICD-10-CM | POA: Diagnosis not present

## 2021-03-19 DIAGNOSIS — M545 Low back pain, unspecified: Secondary | ICD-10-CM | POA: Diagnosis not present

## 2021-04-05 DIAGNOSIS — Z79899 Other long term (current) drug therapy: Secondary | ICD-10-CM | POA: Diagnosis not present

## 2021-04-05 DIAGNOSIS — M4802 Spinal stenosis, cervical region: Secondary | ICD-10-CM | POA: Diagnosis not present

## 2021-04-05 DIAGNOSIS — M47812 Spondylosis without myelopathy or radiculopathy, cervical region: Secondary | ICD-10-CM | POA: Diagnosis not present

## 2021-04-05 DIAGNOSIS — M7062 Trochanteric bursitis, left hip: Secondary | ICD-10-CM | POA: Diagnosis not present

## 2021-04-05 DIAGNOSIS — M961 Postlaminectomy syndrome, not elsewhere classified: Secondary | ICD-10-CM | POA: Diagnosis not present

## 2021-04-05 DIAGNOSIS — M48062 Spinal stenosis, lumbar region with neurogenic claudication: Secondary | ICD-10-CM | POA: Diagnosis not present

## 2021-04-29 DIAGNOSIS — M48062 Spinal stenosis, lumbar region with neurogenic claudication: Secondary | ICD-10-CM | POA: Diagnosis not present

## 2021-04-29 DIAGNOSIS — R030 Elevated blood-pressure reading, without diagnosis of hypertension: Secondary | ICD-10-CM | POA: Diagnosis not present

## 2021-05-09 DIAGNOSIS — Z7189 Other specified counseling: Secondary | ICD-10-CM | POA: Diagnosis not present

## 2021-05-09 DIAGNOSIS — Z0000 Encounter for general adult medical examination without abnormal findings: Secondary | ICD-10-CM | POA: Diagnosis not present

## 2021-05-09 DIAGNOSIS — Z299 Encounter for prophylactic measures, unspecified: Secondary | ICD-10-CM | POA: Diagnosis not present

## 2021-05-09 DIAGNOSIS — E7800 Pure hypercholesterolemia, unspecified: Secondary | ICD-10-CM | POA: Diagnosis not present

## 2021-05-09 DIAGNOSIS — R5383 Other fatigue: Secondary | ICD-10-CM | POA: Diagnosis not present

## 2021-05-09 DIAGNOSIS — Z79899 Other long term (current) drug therapy: Secondary | ICD-10-CM | POA: Diagnosis not present

## 2021-06-03 DIAGNOSIS — M48062 Spinal stenosis, lumbar region with neurogenic claudication: Secondary | ICD-10-CM | POA: Diagnosis not present

## 2021-06-03 DIAGNOSIS — I10 Essential (primary) hypertension: Secondary | ICD-10-CM | POA: Diagnosis not present

## 2021-06-19 DIAGNOSIS — M545 Low back pain, unspecified: Secondary | ICD-10-CM | POA: Diagnosis not present

## 2021-06-19 DIAGNOSIS — E7800 Pure hypercholesterolemia, unspecified: Secondary | ICD-10-CM | POA: Diagnosis not present

## 2021-06-22 DIAGNOSIS — M47816 Spondylosis without myelopathy or radiculopathy, lumbar region: Secondary | ICD-10-CM | POA: Diagnosis not present

## 2021-07-04 DIAGNOSIS — M4802 Spinal stenosis, cervical region: Secondary | ICD-10-CM | POA: Diagnosis not present

## 2021-07-04 DIAGNOSIS — M7062 Trochanteric bursitis, left hip: Secondary | ICD-10-CM | POA: Diagnosis not present

## 2021-07-04 DIAGNOSIS — M5442 Lumbago with sciatica, left side: Secondary | ICD-10-CM | POA: Diagnosis not present

## 2021-07-04 DIAGNOSIS — Z79899 Other long term (current) drug therapy: Secondary | ICD-10-CM | POA: Diagnosis not present

## 2021-07-04 DIAGNOSIS — M47812 Spondylosis without myelopathy or radiculopathy, cervical region: Secondary | ICD-10-CM | POA: Diagnosis not present

## 2021-07-04 DIAGNOSIS — R030 Elevated blood-pressure reading, without diagnosis of hypertension: Secondary | ICD-10-CM | POA: Diagnosis not present

## 2021-07-04 DIAGNOSIS — M48062 Spinal stenosis, lumbar region with neurogenic claudication: Secondary | ICD-10-CM | POA: Diagnosis not present

## 2021-07-04 DIAGNOSIS — M47816 Spondylosis without myelopathy or radiculopathy, lumbar region: Secondary | ICD-10-CM | POA: Diagnosis not present

## 2021-07-04 DIAGNOSIS — M961 Postlaminectomy syndrome, not elsewhere classified: Secondary | ICD-10-CM | POA: Diagnosis not present

## 2021-07-15 ENCOUNTER — Ambulatory Visit: Payer: 59 | Admitting: Urology

## 2021-07-19 DIAGNOSIS — M47816 Spondylosis without myelopathy or radiculopathy, lumbar region: Secondary | ICD-10-CM | POA: Diagnosis not present

## 2021-07-28 DIAGNOSIS — Z79899 Other long term (current) drug therapy: Secondary | ICD-10-CM | POA: Diagnosis not present

## 2021-07-28 DIAGNOSIS — M961 Postlaminectomy syndrome, not elsewhere classified: Secondary | ICD-10-CM | POA: Diagnosis not present

## 2021-07-28 DIAGNOSIS — M7062 Trochanteric bursitis, left hip: Secondary | ICD-10-CM | POA: Diagnosis not present

## 2021-07-28 DIAGNOSIS — M47816 Spondylosis without myelopathy or radiculopathy, lumbar region: Secondary | ICD-10-CM | POA: Diagnosis not present

## 2021-08-08 ENCOUNTER — Other Ambulatory Visit: Payer: Self-pay

## 2021-08-08 ENCOUNTER — Encounter: Payer: Self-pay | Admitting: Urology

## 2021-08-08 ENCOUNTER — Ambulatory Visit: Payer: 59 | Admitting: Urology

## 2021-08-08 VITALS — BP 163/75 | HR 96

## 2021-08-08 DIAGNOSIS — N401 Enlarged prostate with lower urinary tract symptoms: Secondary | ICD-10-CM

## 2021-08-08 DIAGNOSIS — R972 Elevated prostate specific antigen [PSA]: Secondary | ICD-10-CM | POA: Diagnosis not present

## 2021-08-08 DIAGNOSIS — N138 Other obstructive and reflux uropathy: Secondary | ICD-10-CM

## 2021-08-08 LAB — URINALYSIS, ROUTINE W REFLEX MICROSCOPIC
Bilirubin, UA: NEGATIVE
Glucose, UA: NEGATIVE
Ketones, UA: NEGATIVE
Leukocytes,UA: NEGATIVE
Nitrite, UA: NEGATIVE
Protein,UA: NEGATIVE
Specific Gravity, UA: 1.025 (ref 1.005–1.030)
Urobilinogen, Ur: 0.2 mg/dL (ref 0.2–1.0)
pH, UA: 5.5 (ref 5.0–7.5)

## 2021-08-08 LAB — MICROSCOPIC EXAMINATION
Bacteria, UA: NONE SEEN
Epithelial Cells (non renal): NONE SEEN /hpf (ref 0–10)
RBC, Urine: NONE SEEN /hpf (ref 0–2)
Renal Epithel, UA: NONE SEEN /hpf
WBC, UA: NONE SEEN /hpf (ref 0–5)

## 2021-08-08 NOTE — Progress Notes (Signed)
Assessment: 1. Elevated PSA   2. BPH with obstruction/lower urinary tract symptoms     Plan: Today I had a long discussion with the patient regarding PSA testing and the rationale and controversies of prostate cancer early detection.  I discussed the pros and cons of further evaluation including prostate U/S and biopsy. Potential adverse events and complications reviewed.  He expressed understanding of these issues. Free and total PSA today Will call with results and recommendations   Chief Complaint:  Chief Complaint  Patient presents with   Elevated PSA     History of Present Illness:  Corey Clarke is a 65 y.o. year old male who is seen in consultation from Glenda Chroman, MD  for evaluation of elevated PSA. His most recent PSA from May 2022 was 7.4.  He reports a history of elevated PSA results >4.0.  No lab results available for review.  No prior biopsy.  No family history of prostate cancer.   He has a history of LUTS with frequency, nocturia.  No dysuria or gross hematuria.  He is currently on tamsulosin. AUA score = 5 today.   Past Medical History:  Past Medical History:  Diagnosis Date   Arthritis    Complication of anesthesia    Elevated cholesterol    PONV (postoperative nausea and vomiting)    and abd pain   S/P knee replacement    patient reports after left knee replacement  01-10-18 he went home and 3 days later began having fevers  repors he had to be prscribed oral antbiotics fot a "blood infection"     Past Surgical History:  Past Surgical History:  Procedure Laterality Date   BACK SURGERY     spinal fusion 3,4,5, 11/24/16 Dr. Wiliam Ke   CARPAL TUNNEL RELEASE     left hand   staph infection     Left elbow cleaned out 20 years plus   TOTAL HIP ARTHROPLASTY Right 12/10/2018   Procedure: TOTAL HIP ARTHROPLASTY ANTERIOR APPROACH;  Surgeon: Paralee Cancel, MD;  Location: [facility];  Service: Orthopedics;  Laterality: Right;  70 mins   TOTAL KNEE ARTHROPLASTY Right  06/21/2017   Procedure: RIGHT TOTAL KNEE ARTHROPLASTY;  Surgeon: Susa Day, MD;  Location: [facility];  Service: Orthopedics;  Laterality: Right;  120 mins   TOTAL KNEE ARTHROPLASTY Left 01/10/2018   Procedure: LEFT TOTAL KNEE ARTHROPLASTY;  Surgeon: Susa Day, MD;  Location: [facility];  Service: Orthopedics;  Laterality: Left;  120 mins    Allergies:  Allergies  Allergen Reactions   Ciprofloxacin Hives   Other Nausea And Vomiting and Other (See Comments)    12-14-2016 anesthetics during spinal fusion procedure  cause nausea/vomiting    Family History:  No family history on file.  Social History:  Social History   Tobacco Use   Smoking status: Never   Smokeless tobacco: Never  Vaping Use   Vaping Use: Never used  Substance Use Topics   Alcohol use: Yes    Alcohol/week: 1.0 standard drink    Types: 1 Cans of beer per week    Comment: 3-4 x week   Drug use: No    Review of symptoms:  Constitutional:  Negative for unexplained weight loss, night sweats, fever, chills ENT:  Negative for nose bleeds, sinus pain, painful swallowing CV:  Negative for chest pain, shortness of breath, exercise intolerance, palpitations, loss of consciousness Resp:  Negative for cough, wheezing, shortness of breath GI:  Negative for nausea, vomiting, diarrhea, bloody  stools GU:  Positives noted in HPI; otherwise negative for gross hematuria, dysuria, urinary incontinence Neuro:  Negative for seizures, poor balance, limb weakness, slurred speech Psych:  Negative for lack of energy, depression, anxiety Endocrine:  Negative for polydipsia, polyuria, symptoms of hypoglycemia (dizziness, hunger, sweating) Hematologic:  Negative for anemia, purpura, petechia, prolonged or excessive bleeding, use of anticoagulants  Allergic:  Negative for difficulty breathing or choking as a result of exposure to anything; no shellfish allergy; no allergic response (rash/itch) to materials, foods  Physical exam: BP (!)  163/75   Pulse 96  GENERAL APPEARANCE:  Well appearing, well developed, well nourished, NAD HEENT: Atraumatic, Normocephalic, oropharynx clear. NECK: Supple without lymphadenopathy or thyromegaly. LUNGS: Clear to auscultation bilaterally. HEART: Regular Rate and Rhythm without murmurs, gallops, or rubs. ABDOMEN: Soft, non-tender, No Masses. EXTREMITIES: Moves all extremities well.  Without clubbing, cyanosis, or edema. NEUROLOGIC:  Alert and oriented x 3, normal gait, CN II-XII grossly intact.  MENTAL STATUS:  Appropriate. BACK:  Non-tender to palpation.  No CVAT SKIN:  Warm, dry and intact.   GU: Penis:  circumcised Meatus: Normal Scrotum: normal, no masses Testis: normal without masses  Epididymis: normal Prostate: 40 g, NT, no nodules Rectum: Normal tone, normal prostate, no masses or tenderness   Results: Results for orders placed or performed in visit on 08/08/21 (from the past 24 hour(s))  Microscopic Examination   Collection Time: 08/08/21  2:17 PM   Urine  Result Value Ref Range   WBC, UA None seen 0 - 5 /hpf   RBC None seen 0 - 2 /hpf   Epithelial Cells (non renal) None seen 0 - 10 /hpf   Renal Epithel, UA None seen None seen /hpf   Bacteria, UA None seen None seen/Few  Urinalysis, Routine w reflex microscopic   Collection Time: 08/08/21  2:17 PM  Result Value Ref Range   Specific Gravity, UA 1.025 1.005 - 1.030   pH, UA 5.5 5.0 - 7.5   Color, UA Amber (A) Yellow   Appearance Ur Clear Clear   Leukocytes,UA Negative Negative   Protein,UA Negative Negative/Trace   Glucose, UA Negative Negative   Ketones, UA Negative Negative   RBC, UA Trace (A) Negative   Bilirubin, UA Negative Negative   Urobilinogen, Ur 0.2 0.2 - 1.0 mg/dL   Nitrite, UA Negative Negative   Microscopic Examination See below:      Bladder scan:  21 ml

## 2021-08-08 NOTE — Progress Notes (Signed)
post void residual=21  Urological Symptom Review  Patient is experiencing the following symptoms: Frequent urination Get up at night to urinate Leakage of urine Erection problems (male only)   Review of Systems  Gastrointestinal (upper)  : Negative for upper GI symptoms  Gastrointestinal (lower) : Negative for lower GI symptoms  Constitutional : Negative for symptoms  Skin: Negative for skin symptoms  Eyes: Negative for eye symptoms  Ear/Nose/Throat : Sinus problems  Hematologic/Lymphatic: Negative for Hematologic/Lymphatic symptoms  Cardiovascular : Negative for cardiovascular symptoms  Respiratory : Negative for respiratory symptoms  Endocrine: Negative for endocrine symptoms  Musculoskeletal: Back pain Joint pain  Neurological: Negative for neurological symptoms  Psychologic: Negative for psychiatric symptoms

## 2021-08-09 LAB — PSA, TOTAL AND FREE
PSA, Free Pct: 17.3 %
PSA, Free: 0.78 ng/mL
Prostate Specific Ag, Serum: 4.5 ng/mL — ABNORMAL HIGH (ref 0.0–4.0)

## 2021-08-10 ENCOUNTER — Telehealth: Payer: Self-pay

## 2021-08-10 DIAGNOSIS — R972 Elevated prostate specific antigen [PSA]: Secondary | ICD-10-CM

## 2021-08-10 NOTE — Telephone Encounter (Signed)
-----   Message from Primus Bravo, MD sent at 08/10/2021  1:16 PM EDT ----- Please contact patient to schedule for TRUS/BX.   He is aware of PSA results. Advised him to hold his diclofenac for 5 days prior to biopsy.

## 2021-08-10 NOTE — Telephone Encounter (Signed)
Biopsy date, time, and instructions went over with patient via phone. Instructions sent via my chart and mail.

## 2021-08-15 ENCOUNTER — Telehealth: Payer: Self-pay | Admitting: Urology

## 2021-08-15 NOTE — Telephone Encounter (Signed)
Patient requesting biopsy instructions to be sent to him via MyChart.

## 2021-08-15 NOTE — Telephone Encounter (Signed)
Instructions sent 08/10/2021

## 2021-08-22 ENCOUNTER — Other Ambulatory Visit: Payer: 59 | Admitting: Urology

## 2021-08-24 DIAGNOSIS — M47816 Spondylosis without myelopathy or radiculopathy, lumbar region: Secondary | ICD-10-CM | POA: Diagnosis not present

## 2021-08-30 NOTE — Progress Notes (Signed)
Assessment: 1. Elevated PSA     Plan: Post biopsy instructions given Return to office in 7-10 days for biopsy results  Chief Complaint:  Chief Complaint  Patient presents with   Elevated PSA    History of Present Illness:  Corey Clarke is a 65 y.o. year old male who is seen for continued evaluation of elevated PSA. His recent PSA from May 2022 was 7.4.  He reports a history of elevated PSA results >4.0.   No prior biopsy.  No family history of prostate cancer.  PSA from 08/08/21 was 4.5 with 17.3% free.   He has a history of LUTS with frequency, nocturia.  No dysuria or gross hematuria.  He is currently on tamsulosin. AUA score = 5.  He presents today for prostate U/S and biopsy.  Portions of the above documentation were copied from a prior visit for review purposes only.   Past Medical History:  Past Medical History:  Diagnosis Date   Arthritis    Complication of anesthesia    Elevated cholesterol    PONV (postoperative nausea and vomiting)    and abd pain   S/P knee replacement    patient reports after left knee replacement  01-10-18 he went home and 3 days later began having fevers  repors he had to be prscribed oral antbiotics fot a "blood infection"     Past Surgical History:  Past Surgical History:  Procedure Laterality Date   BACK SURGERY     spinal fusion 3,4,5, 11/24/16 Dr. Wiliam Ke   CARPAL TUNNEL RELEASE     left hand   staph infection     Left elbow cleaned out 20 years plus   TOTAL HIP ARTHROPLASTY Right 12/10/2018   Procedure: TOTAL HIP ARTHROPLASTY ANTERIOR APPROACH;  Surgeon: Paralee Cancel, MD;  Location: [facility];  Service: Orthopedics;  Laterality: Right;  70 mins   TOTAL KNEE ARTHROPLASTY Right 06/21/2017   Procedure: RIGHT TOTAL KNEE ARTHROPLASTY;  Surgeon: Susa Day, MD;  Location: [facility];  Service: Orthopedics;  Laterality: Right;  120 mins   TOTAL KNEE ARTHROPLASTY Left 01/10/2018   Procedure: LEFT TOTAL KNEE ARTHROPLASTY;  Surgeon: Susa Day, MD;  Location: [facility];  Service: Orthopedics;  Laterality: Left;  120 mins    Allergies:  Allergies  Allergen Reactions   Ciprofloxacin Hives   Other Nausea And Vomiting and Other (See Comments)    12-14-2016 anesthetics during spinal fusion procedure  cause nausea/vomiting    Family History:  No family history on file.  Social History:  Social History   Tobacco Use   Smoking status: Never   Smokeless tobacco: Never  Vaping Use   Vaping Use: Never used  Substance Use Topics   Alcohol use: Yes    Alcohol/week: 1.0 standard drink    Types: 1 Cans of beer per week    Comment: 3-4 x week   Drug use: No    Review of symptoms:  Constitutional:  Negative for unexplained weight loss, night sweats, fever, chills ENT:  Negative for nose bleeds, sinus pain, painful swallowing CV:  Negative for chest pain, shortness of breath, exercise intolerance, palpitations, loss of consciousness Resp:  Negative for cough, wheezing, shortness of breath GI:  Negative for nausea, vomiting, diarrhea, bloody stools GU:  Positives noted in HPI; otherwise negative for gross hematuria, dysuria, urinary incontinence Neuro:  Negative for seizures, poor balance, limb weakness, slurred speech Psych:  Negative for lack of energy, depression, anxiety Endocrine:  Negative for polydipsia,  polyuria, symptoms of hypoglycemia (dizziness, hunger, sweating) Hematologic:  Negative for anemia, purpura, petechia, prolonged or excessive bleeding, use of anticoagulants  Allergic:  Negative for difficulty breathing or choking as a result of exposure to anything; no shellfish allergy; no allergic response (rash/itch) to materials, foods  Physical exam: GENERAL APPEARANCE:  Well appearing, well developed, well nourished, NAD HEENT:  Atraumatic, normocephalic, oropharynx clear NECK:  Supple without lymphadenopathy or thyromegaly ABDOMEN:  Soft, non-tender, no masses EXTREMITIES:  Moves all extremities well,  without clubbing, cyanosis, or edema NEUROLOGIC:  Alert and oriented x 3, normal gait, CN II-XII grossly intact MENTAL STATUS:  appropriate BACK:  Non-tender to palpation, No CVAT SKIN:  Warm, dry, and intact  Results: None   TRANSRECTAL ULTRASOUND AND PROSTATE BIOPSY  Indication:  Elevated PSA  Prophylactic antibiotic administration: Rocephin  All medications that could result in increased bleeding were discontinued within an appropriate period of the time of biopsy.  Risk including bleeding and infection were discussed.  Informed consent was obtained.  The patient was placed in the left lateral decubitus position.  PROCEDURE 1.  TRANSRECTAL ULTRASOUND OF THE PROSTATE  The 7 MHz transrectal probe was used to image the prostate.  Anal stenosis was not noted.  TRUS volume: 40/89 ml  Hypoechoic areas: None  Hyperechoic areas: None  Central calcifications: present  Margins:  normal   PROCEDURE 2:  PROSTATE BIOPSY  A periprostatic block was performed using 1% lidocaine and transrectal ultrasound guidance. Under transrectal ultrasound guidance, and using the Biopty gun, prostate biopsies were obtained systematically from the apex, mid gland, and base bilaterally.  A total of 12 cores were obtained.  Hemostasis was obtained with gentle pressure on the prostate.  The procedures were well-tolerated.  No significant bleeding was noted at the end of the procedure.  The patient was stable for discharge from the office.

## 2021-08-31 ENCOUNTER — Other Ambulatory Visit: Payer: Self-pay

## 2021-08-31 ENCOUNTER — Encounter (HOSPITAL_COMMUNITY): Payer: Self-pay

## 2021-08-31 ENCOUNTER — Encounter: Payer: Self-pay | Admitting: Urology

## 2021-08-31 ENCOUNTER — Ambulatory Visit (INDEPENDENT_AMBULATORY_CARE_PROVIDER_SITE_OTHER): Payer: Medicare Other | Admitting: Urology

## 2021-08-31 ENCOUNTER — Other Ambulatory Visit: Payer: Self-pay | Admitting: Urology

## 2021-08-31 ENCOUNTER — Ambulatory Visit (HOSPITAL_COMMUNITY)
Admission: RE | Admit: 2021-08-31 | Discharge: 2021-08-31 | Disposition: A | Discharge status: Home / Self Care | Payer: Medicare Other | Source: Ambulatory Visit | Attending: Urology | Admitting: Urology

## 2021-08-31 DIAGNOSIS — D122 Benign neoplasm of ascending colon: Secondary | ICD-10-CM | POA: Diagnosis not present

## 2021-08-31 DIAGNOSIS — R972 Elevated prostate specific antigen [PSA]: Secondary | ICD-10-CM | POA: Diagnosis present

## 2021-08-31 MED ORDER — LIDOCAINE HCL (PF) 1 % IJ SOLN: 2.1000 mL | Freq: Once | INTRAMUSCULAR | Status: DC

## 2021-08-31 MED ORDER — CEFTRIAXONE SODIUM 1 G IJ SOLR: 1.0000 g | Freq: Once | INTRAMUSCULAR | Status: DC

## 2021-08-31 MED ORDER — LIDOCAINE HCL (PF) 2 % IJ SOLN
10.0000 mL | Freq: Once | INTRAMUSCULAR | Status: DC
Start: 2021-08-31 — End: 2021-09-08

## 2021-08-31 MED ORDER — CEFTRIAXONE SODIUM 1 G IJ SOLR
INTRAMUSCULAR | Status: AC
  Administered 2021-08-31: 1 g via INTRAMUSCULAR
  Filled 2021-08-31: qty 10

## 2021-08-31 MED ORDER — LIDOCAINE HCL (PF) 1 % IJ SOLN
INTRAMUSCULAR | Status: AC
  Administered 2021-08-31: 2.1 mL
  Filled 2021-08-31: qty 5

## 2021-08-31 MED ORDER — LIDOCAINE HCL (PF) 2 % IJ SOLN
INTRAMUSCULAR | Status: AC
  Administered 2021-08-31: 10 mL
  Filled 2021-08-31: qty 10

## 2021-08-31 NOTE — Progress Notes (Signed)
PT tolerated prostate biopsy procedure and IM antibiotic injection well today. Labs obtained and sent for pathology. PT ambulatory at discharge with no acute distress noted and verbalized understanding of discharge instructions. PT to follow up with urologist as scheduled on 09/08/21.

## 2021-09-01 ENCOUNTER — Other Ambulatory Visit: Payer: 59 | Admitting: Urology

## 2021-09-01 ENCOUNTER — Encounter (HOSPITAL_COMMUNITY): Payer: Self-pay

## 2021-09-01 ENCOUNTER — Ambulatory Visit (HOSPITAL_COMMUNITY)
Admission: RE | Admit: 2021-09-01 | Discharge: 2021-09-01 | Disposition: A | Discharge status: Home / Self Care | Payer: Medicare Other | Source: Ambulatory Visit | Attending: Urology | Admitting: Urology

## 2021-09-07 NOTE — Progress Notes (Signed)
Patient notified of negative biopsy results. Recommendations for follow-up in 6 months with PSA discussed with the patient.  He understands and agrees with plan.

## 2021-09-08 ENCOUNTER — Ambulatory Visit: Payer: 59 | Admitting: Urology

## 2021-09-15 DIAGNOSIS — R1032 Left lower quadrant pain: Secondary | ICD-10-CM | POA: Diagnosis not present

## 2021-09-15 DIAGNOSIS — Z299 Encounter for prophylactic measures, unspecified: Secondary | ICD-10-CM | POA: Diagnosis not present

## 2021-09-15 DIAGNOSIS — J069 Acute upper respiratory infection, unspecified: Secondary | ICD-10-CM | POA: Diagnosis not present

## 2021-09-15 DIAGNOSIS — M25551 Pain in right hip: Secondary | ICD-10-CM | POA: Diagnosis not present

## 2021-09-15 DIAGNOSIS — E781 Pure hyperglyceridemia: Secondary | ICD-10-CM | POA: Diagnosis not present

## 2021-09-22 DIAGNOSIS — M47816 Spondylosis without myelopathy or radiculopathy, lumbar region: Secondary | ICD-10-CM | POA: Diagnosis not present

## 2021-09-22 DIAGNOSIS — M961 Postlaminectomy syndrome, not elsewhere classified: Secondary | ICD-10-CM | POA: Diagnosis not present

## 2021-09-22 DIAGNOSIS — R030 Elevated blood-pressure reading, without diagnosis of hypertension: Secondary | ICD-10-CM | POA: Diagnosis not present

## 2021-09-23 DIAGNOSIS — M545 Low back pain, unspecified: Secondary | ICD-10-CM | POA: Diagnosis not present

## 2021-09-23 DIAGNOSIS — Z299 Encounter for prophylactic measures, unspecified: Secondary | ICD-10-CM | POA: Diagnosis not present

## 2021-09-23 DIAGNOSIS — E7800 Pure hypercholesterolemia, unspecified: Secondary | ICD-10-CM | POA: Diagnosis not present

## 2021-09-23 DIAGNOSIS — J329 Chronic sinusitis, unspecified: Secondary | ICD-10-CM | POA: Diagnosis not present

## 2021-09-26 DIAGNOSIS — Z1211 Encounter for screening for malignant neoplasm of colon: Secondary | ICD-10-CM | POA: Diagnosis not present

## 2021-09-26 DIAGNOSIS — Z1212 Encounter for screening for malignant neoplasm of rectum: Secondary | ICD-10-CM | POA: Diagnosis not present

## 2021-10-25 DIAGNOSIS — M47816 Spondylosis without myelopathy or radiculopathy, lumbar region: Secondary | ICD-10-CM | POA: Diagnosis not present

## 2021-10-25 DIAGNOSIS — Z79899 Other long term (current) drug therapy: Secondary | ICD-10-CM | POA: Diagnosis not present

## 2021-10-25 DIAGNOSIS — F5101 Primary insomnia: Secondary | ICD-10-CM | POA: Diagnosis not present

## 2021-10-25 DIAGNOSIS — M961 Postlaminectomy syndrome, not elsewhere classified: Secondary | ICD-10-CM | POA: Diagnosis not present

## 2021-11-02 DIAGNOSIS — M461 Sacroiliitis, not elsewhere classified: Secondary | ICD-10-CM | POA: Diagnosis not present

## 2021-11-02 DIAGNOSIS — R030 Elevated blood-pressure reading, without diagnosis of hypertension: Secondary | ICD-10-CM | POA: Diagnosis not present

## 2021-11-30 DIAGNOSIS — M461 Sacroiliitis, not elsewhere classified: Secondary | ICD-10-CM | POA: Diagnosis not present

## 2022-01-09 DIAGNOSIS — R030 Elevated blood-pressure reading, without diagnosis of hypertension: Secondary | ICD-10-CM | POA: Diagnosis not present

## 2022-01-09 DIAGNOSIS — M961 Postlaminectomy syndrome, not elsewhere classified: Secondary | ICD-10-CM | POA: Diagnosis not present

## 2022-01-09 DIAGNOSIS — F5101 Primary insomnia: Secondary | ICD-10-CM | POA: Diagnosis not present

## 2022-01-09 DIAGNOSIS — M47816 Spondylosis without myelopathy or radiculopathy, lumbar region: Secondary | ICD-10-CM | POA: Diagnosis not present

## 2022-01-09 DIAGNOSIS — M461 Sacroiliitis, not elsewhere classified: Secondary | ICD-10-CM | POA: Diagnosis not present

## 2022-01-09 DIAGNOSIS — Z79899 Other long term (current) drug therapy: Secondary | ICD-10-CM | POA: Diagnosis not present

## 2022-01-09 DIAGNOSIS — M48062 Spinal stenosis, lumbar region with neurogenic claudication: Secondary | ICD-10-CM | POA: Diagnosis not present

## 2022-01-17 DIAGNOSIS — M48062 Spinal stenosis, lumbar region with neurogenic claudication: Secondary | ICD-10-CM | POA: Diagnosis not present

## 2022-01-19 DIAGNOSIS — M48062 Spinal stenosis, lumbar region with neurogenic claudication: Secondary | ICD-10-CM | POA: Diagnosis not present

## 2022-01-19 DIAGNOSIS — M461 Sacroiliitis, not elsewhere classified: Secondary | ICD-10-CM | POA: Diagnosis not present

## 2022-01-19 DIAGNOSIS — M47816 Spondylosis without myelopathy or radiculopathy, lumbar region: Secondary | ICD-10-CM | POA: Diagnosis not present

## 2022-01-19 DIAGNOSIS — M961 Postlaminectomy syndrome, not elsewhere classified: Secondary | ICD-10-CM | POA: Diagnosis not present

## 2022-02-01 DIAGNOSIS — M461 Sacroiliitis, not elsewhere classified: Secondary | ICD-10-CM | POA: Diagnosis not present

## 2022-02-20 DIAGNOSIS — M48062 Spinal stenosis, lumbar region with neurogenic claudication: Secondary | ICD-10-CM | POA: Diagnosis not present

## 2022-02-20 DIAGNOSIS — M5416 Radiculopathy, lumbar region: Secondary | ICD-10-CM | POA: Diagnosis not present

## 2022-02-20 DIAGNOSIS — M461 Sacroiliitis, not elsewhere classified: Secondary | ICD-10-CM | POA: Diagnosis not present

## 2022-02-22 DIAGNOSIS — M461 Sacroiliitis, not elsewhere classified: Secondary | ICD-10-CM | POA: Diagnosis not present

## 2022-03-17 DIAGNOSIS — Z789 Other specified health status: Secondary | ICD-10-CM | POA: Diagnosis not present

## 2022-03-17 DIAGNOSIS — M47816 Spondylosis without myelopathy or radiculopathy, lumbar region: Secondary | ICD-10-CM | POA: Diagnosis not present

## 2022-03-17 DIAGNOSIS — M1990 Unspecified osteoarthritis, unspecified site: Secondary | ICD-10-CM | POA: Diagnosis not present

## 2022-03-17 DIAGNOSIS — Z299 Encounter for prophylactic measures, unspecified: Secondary | ICD-10-CM | POA: Diagnosis not present

## 2022-03-28 DIAGNOSIS — M461 Sacroiliitis, not elsewhere classified: Secondary | ICD-10-CM | POA: Diagnosis not present

## 2022-03-29 DIAGNOSIS — Z299 Encounter for prophylactic measures, unspecified: Secondary | ICD-10-CM | POA: Diagnosis not present

## 2022-03-29 DIAGNOSIS — Z789 Other specified health status: Secondary | ICD-10-CM | POA: Diagnosis not present

## 2022-03-29 DIAGNOSIS — R5383 Other fatigue: Secondary | ICD-10-CM | POA: Diagnosis not present

## 2022-03-29 DIAGNOSIS — M545 Low back pain, unspecified: Secondary | ICD-10-CM | POA: Diagnosis not present

## 2022-03-29 DIAGNOSIS — M1990 Unspecified osteoarthritis, unspecified site: Secondary | ICD-10-CM | POA: Diagnosis not present

## 2022-04-11 DIAGNOSIS — M461 Sacroiliitis, not elsewhere classified: Secondary | ICD-10-CM | POA: Diagnosis not present

## 2022-04-11 DIAGNOSIS — M5416 Radiculopathy, lumbar region: Secondary | ICD-10-CM | POA: Diagnosis not present

## 2022-04-11 DIAGNOSIS — M961 Postlaminectomy syndrome, not elsewhere classified: Secondary | ICD-10-CM | POA: Diagnosis not present

## 2022-05-02 DIAGNOSIS — M47816 Spondylosis without myelopathy or radiculopathy, lumbar region: Secondary | ICD-10-CM | POA: Diagnosis not present

## 2022-05-24 DIAGNOSIS — Z79899 Other long term (current) drug therapy: Secondary | ICD-10-CM | POA: Diagnosis not present

## 2022-05-24 DIAGNOSIS — Z7189 Other specified counseling: Secondary | ICD-10-CM | POA: Diagnosis not present

## 2022-05-24 DIAGNOSIS — R5383 Other fatigue: Secondary | ICD-10-CM | POA: Diagnosis not present

## 2022-05-24 DIAGNOSIS — E7800 Pure hypercholesterolemia, unspecified: Secondary | ICD-10-CM | POA: Diagnosis not present

## 2022-05-24 DIAGNOSIS — Z299 Encounter for prophylactic measures, unspecified: Secondary | ICD-10-CM | POA: Diagnosis not present

## 2022-05-24 DIAGNOSIS — Z0000 Encounter for general adult medical examination without abnormal findings: Secondary | ICD-10-CM | POA: Diagnosis not present

## 2022-06-06 DIAGNOSIS — M461 Sacroiliitis, not elsewhere classified: Secondary | ICD-10-CM | POA: Diagnosis not present

## 2022-06-06 DIAGNOSIS — M961 Postlaminectomy syndrome, not elsewhere classified: Secondary | ICD-10-CM | POA: Diagnosis not present

## 2022-06-19 DIAGNOSIS — Z299 Encounter for prophylactic measures, unspecified: Secondary | ICD-10-CM | POA: Diagnosis not present

## 2022-06-19 DIAGNOSIS — M79641 Pain in right hand: Secondary | ICD-10-CM | POA: Diagnosis not present

## 2022-06-19 DIAGNOSIS — M19041 Primary osteoarthritis, right hand: Secondary | ICD-10-CM | POA: Diagnosis not present

## 2022-06-19 DIAGNOSIS — M7989 Other specified soft tissue disorders: Secondary | ICD-10-CM | POA: Diagnosis not present

## 2022-06-19 DIAGNOSIS — Z789 Other specified health status: Secondary | ICD-10-CM | POA: Diagnosis not present

## 2022-06-30 DIAGNOSIS — Z299 Encounter for prophylactic measures, unspecified: Secondary | ICD-10-CM | POA: Diagnosis not present

## 2022-06-30 DIAGNOSIS — M25541 Pain in joints of right hand: Secondary | ICD-10-CM | POA: Diagnosis not present

## 2022-06-30 DIAGNOSIS — M79641 Pain in right hand: Secondary | ICD-10-CM | POA: Diagnosis not present

## 2022-06-30 DIAGNOSIS — M25542 Pain in joints of left hand: Secondary | ICD-10-CM | POA: Diagnosis not present

## 2022-06-30 DIAGNOSIS — M79642 Pain in left hand: Secondary | ICD-10-CM | POA: Diagnosis not present

## 2022-07-03 DIAGNOSIS — M25541 Pain in joints of right hand: Secondary | ICD-10-CM | POA: Diagnosis not present

## 2022-07-28 DIAGNOSIS — R5383 Other fatigue: Secondary | ICD-10-CM | POA: Diagnosis not present

## 2022-07-28 DIAGNOSIS — Z299 Encounter for prophylactic measures, unspecified: Secondary | ICD-10-CM | POA: Diagnosis not present

## 2022-07-28 DIAGNOSIS — M06042 Rheumatoid arthritis without rheumatoid factor, left hand: Secondary | ICD-10-CM | POA: Diagnosis not present

## 2022-07-28 DIAGNOSIS — M06041 Rheumatoid arthritis without rheumatoid factor, right hand: Secondary | ICD-10-CM | POA: Diagnosis not present

## 2022-08-07 DIAGNOSIS — M25541 Pain in joints of right hand: Secondary | ICD-10-CM | POA: Diagnosis not present

## 2022-08-07 DIAGNOSIS — Z79899 Other long term (current) drug therapy: Secondary | ICD-10-CM | POA: Diagnosis not present

## 2022-08-07 DIAGNOSIS — M06049 Rheumatoid arthritis without rheumatoid factor, unspecified hand: Secondary | ICD-10-CM | POA: Diagnosis not present

## 2022-08-14 DIAGNOSIS — M461 Sacroiliitis, not elsewhere classified: Secondary | ICD-10-CM | POA: Diagnosis not present

## 2022-08-14 DIAGNOSIS — M961 Postlaminectomy syndrome, not elsewhere classified: Secondary | ICD-10-CM | POA: Diagnosis not present

## 2022-08-21 DIAGNOSIS — M06049 Rheumatoid arthritis without rheumatoid factor, unspecified hand: Secondary | ICD-10-CM | POA: Diagnosis not present

## 2022-08-25 DIAGNOSIS — M79642 Pain in left hand: Secondary | ICD-10-CM | POA: Diagnosis not present

## 2022-08-25 DIAGNOSIS — M06049 Rheumatoid arthritis without rheumatoid factor, unspecified hand: Secondary | ICD-10-CM | POA: Diagnosis not present

## 2022-08-25 DIAGNOSIS — Z79899 Other long term (current) drug therapy: Secondary | ICD-10-CM | POA: Diagnosis not present

## 2022-08-25 DIAGNOSIS — M79641 Pain in right hand: Secondary | ICD-10-CM | POA: Diagnosis not present

## 2022-08-25 DIAGNOSIS — Z299 Encounter for prophylactic measures, unspecified: Secondary | ICD-10-CM | POA: Diagnosis not present

## 2022-09-01 DIAGNOSIS — M461 Sacroiliitis, not elsewhere classified: Secondary | ICD-10-CM | POA: Diagnosis not present

## 2022-09-04 DIAGNOSIS — M06049 Rheumatoid arthritis without rheumatoid factor, unspecified hand: Secondary | ICD-10-CM | POA: Diagnosis not present

## 2022-10-19 DIAGNOSIS — R5383 Other fatigue: Secondary | ICD-10-CM | POA: Diagnosis not present

## 2022-10-19 DIAGNOSIS — R42 Dizziness and giddiness: Secondary | ICD-10-CM | POA: Diagnosis not present

## 2022-10-19 DIAGNOSIS — Z299 Encounter for prophylactic measures, unspecified: Secondary | ICD-10-CM | POA: Diagnosis not present

## 2022-10-20 DIAGNOSIS — Z299 Encounter for prophylactic measures, unspecified: Secondary | ICD-10-CM | POA: Diagnosis not present

## 2022-10-20 DIAGNOSIS — Z79899 Other long term (current) drug therapy: Secondary | ICD-10-CM | POA: Diagnosis not present

## 2022-10-20 DIAGNOSIS — Z789 Other specified health status: Secondary | ICD-10-CM | POA: Diagnosis not present

## 2022-10-20 DIAGNOSIS — M06049 Rheumatoid arthritis without rheumatoid factor, unspecified hand: Secondary | ICD-10-CM | POA: Diagnosis not present

## 2022-10-20 DIAGNOSIS — R5383 Other fatigue: Secondary | ICD-10-CM | POA: Diagnosis not present

## 2022-10-20 DIAGNOSIS — J329 Chronic sinusitis, unspecified: Secondary | ICD-10-CM | POA: Diagnosis not present

## 2022-10-23 DIAGNOSIS — R42 Dizziness and giddiness: Secondary | ICD-10-CM | POA: Diagnosis not present

## 2022-10-25 DIAGNOSIS — M961 Postlaminectomy syndrome, not elsewhere classified: Secondary | ICD-10-CM | POA: Diagnosis not present

## 2022-10-25 DIAGNOSIS — M461 Sacroiliitis, not elsewhere classified: Secondary | ICD-10-CM | POA: Diagnosis not present

## 2022-11-06 DIAGNOSIS — M06049 Rheumatoid arthritis without rheumatoid factor, unspecified hand: Secondary | ICD-10-CM | POA: Diagnosis not present

## 2022-12-08 DIAGNOSIS — R0981 Nasal congestion: Secondary | ICD-10-CM | POA: Diagnosis not present

## 2022-12-08 DIAGNOSIS — J069 Acute upper respiratory infection, unspecified: Secondary | ICD-10-CM | POA: Diagnosis not present

## 2022-12-08 DIAGNOSIS — Z299 Encounter for prophylactic measures, unspecified: Secondary | ICD-10-CM | POA: Diagnosis not present

## 2022-12-11 DIAGNOSIS — M461 Sacroiliitis, not elsewhere classified: Secondary | ICD-10-CM | POA: Diagnosis not present

## 2022-12-19 DIAGNOSIS — M06049 Rheumatoid arthritis without rheumatoid factor, unspecified hand: Secondary | ICD-10-CM | POA: Diagnosis not present

## 2022-12-29 DIAGNOSIS — M06049 Rheumatoid arthritis without rheumatoid factor, unspecified hand: Secondary | ICD-10-CM | POA: Diagnosis not present

## 2022-12-29 DIAGNOSIS — M25511 Pain in right shoulder: Secondary | ICD-10-CM | POA: Diagnosis not present

## 2022-12-29 DIAGNOSIS — Z79899 Other long term (current) drug therapy: Secondary | ICD-10-CM | POA: Diagnosis not present

## 2022-12-29 DIAGNOSIS — Z299 Encounter for prophylactic measures, unspecified: Secondary | ICD-10-CM | POA: Diagnosis not present

## 2023-01-17 DIAGNOSIS — M461 Sacroiliitis, not elsewhere classified: Secondary | ICD-10-CM | POA: Diagnosis not present

## 2023-01-17 DIAGNOSIS — M961 Postlaminectomy syndrome, not elsewhere classified: Secondary | ICD-10-CM | POA: Diagnosis not present

## 2023-03-12 DIAGNOSIS — M06049 Rheumatoid arthritis without rheumatoid factor, unspecified hand: Secondary | ICD-10-CM | POA: Diagnosis not present

## 2023-03-13 DIAGNOSIS — M461 Sacroiliitis, not elsewhere classified: Secondary | ICD-10-CM | POA: Diagnosis not present

## 2023-03-23 DIAGNOSIS — Z299 Encounter for prophylactic measures, unspecified: Secondary | ICD-10-CM | POA: Diagnosis not present

## 2023-03-23 DIAGNOSIS — M79642 Pain in left hand: Secondary | ICD-10-CM | POA: Diagnosis not present

## 2023-03-23 DIAGNOSIS — D849 Immunodeficiency, unspecified: Secondary | ICD-10-CM | POA: Diagnosis not present

## 2023-03-23 DIAGNOSIS — M06049 Rheumatoid arthritis without rheumatoid factor, unspecified hand: Secondary | ICD-10-CM | POA: Diagnosis not present

## 2023-03-23 DIAGNOSIS — M79641 Pain in right hand: Secondary | ICD-10-CM | POA: Diagnosis not present

## 2023-04-18 DIAGNOSIS — M961 Postlaminectomy syndrome, not elsewhere classified: Secondary | ICD-10-CM | POA: Diagnosis not present

## 2023-04-18 DIAGNOSIS — M461 Sacroiliitis, not elsewhere classified: Secondary | ICD-10-CM | POA: Diagnosis not present

## 2023-04-18 DIAGNOSIS — M5416 Radiculopathy, lumbar region: Secondary | ICD-10-CM | POA: Diagnosis not present

## 2023-04-27 DIAGNOSIS — M06049 Rheumatoid arthritis without rheumatoid factor, unspecified hand: Secondary | ICD-10-CM | POA: Diagnosis not present

## 2023-04-27 DIAGNOSIS — Z7189 Other specified counseling: Secondary | ICD-10-CM | POA: Diagnosis not present

## 2023-04-27 DIAGNOSIS — Z299 Encounter for prophylactic measures, unspecified: Secondary | ICD-10-CM | POA: Diagnosis not present

## 2023-04-27 DIAGNOSIS — D849 Immunodeficiency, unspecified: Secondary | ICD-10-CM | POA: Diagnosis not present

## 2023-04-27 DIAGNOSIS — Z0000 Encounter for general adult medical examination without abnormal findings: Secondary | ICD-10-CM | POA: Diagnosis not present

## 2023-06-11 DIAGNOSIS — M06049 Rheumatoid arthritis without rheumatoid factor, unspecified hand: Secondary | ICD-10-CM | POA: Diagnosis not present

## 2023-06-15 DIAGNOSIS — M461 Sacroiliitis, not elsewhere classified: Secondary | ICD-10-CM | POA: Diagnosis not present

## 2023-06-21 DIAGNOSIS — M79642 Pain in left hand: Secondary | ICD-10-CM | POA: Diagnosis not present

## 2023-06-21 DIAGNOSIS — M79641 Pain in right hand: Secondary | ICD-10-CM | POA: Diagnosis not present

## 2023-06-21 DIAGNOSIS — M25519 Pain in unspecified shoulder: Secondary | ICD-10-CM | POA: Diagnosis not present

## 2023-06-21 DIAGNOSIS — Z299 Encounter for prophylactic measures, unspecified: Secondary | ICD-10-CM | POA: Diagnosis not present

## 2023-06-21 DIAGNOSIS — D849 Immunodeficiency, unspecified: Secondary | ICD-10-CM | POA: Diagnosis not present

## 2023-07-11 DIAGNOSIS — M7541 Impingement syndrome of right shoulder: Secondary | ICD-10-CM | POA: Diagnosis not present

## 2023-07-17 DIAGNOSIS — M961 Postlaminectomy syndrome, not elsewhere classified: Secondary | ICD-10-CM | POA: Diagnosis not present

## 2023-07-17 DIAGNOSIS — M461 Sacroiliitis, not elsewhere classified: Secondary | ICD-10-CM | POA: Diagnosis not present

## 2023-07-18 DIAGNOSIS — M7541 Impingement syndrome of right shoulder: Secondary | ICD-10-CM | POA: Diagnosis not present

## 2023-07-25 DIAGNOSIS — M7541 Impingement syndrome of right shoulder: Secondary | ICD-10-CM | POA: Diagnosis not present

## 2023-07-25 DIAGNOSIS — Z299 Encounter for prophylactic measures, unspecified: Secondary | ICD-10-CM | POA: Diagnosis not present

## 2023-07-25 DIAGNOSIS — E7800 Pure hypercholesterolemia, unspecified: Secondary | ICD-10-CM | POA: Diagnosis not present

## 2023-07-25 DIAGNOSIS — R5383 Other fatigue: Secondary | ICD-10-CM | POA: Diagnosis not present

## 2023-07-25 DIAGNOSIS — Z0000 Encounter for general adult medical examination without abnormal findings: Secondary | ICD-10-CM | POA: Diagnosis not present

## 2023-07-25 DIAGNOSIS — Z79899 Other long term (current) drug therapy: Secondary | ICD-10-CM | POA: Diagnosis not present

## 2023-07-27 DIAGNOSIS — M25552 Pain in left hip: Secondary | ICD-10-CM | POA: Diagnosis not present

## 2023-07-27 DIAGNOSIS — Z299 Encounter for prophylactic measures, unspecified: Secondary | ICD-10-CM | POA: Diagnosis not present

## 2023-08-01 DIAGNOSIS — M461 Sacroiliitis, not elsewhere classified: Secondary | ICD-10-CM | POA: Diagnosis not present

## 2023-08-01 DIAGNOSIS — M7541 Impingement syndrome of right shoulder: Secondary | ICD-10-CM | POA: Diagnosis not present

## 2023-08-01 DIAGNOSIS — M5416 Radiculopathy, lumbar region: Secondary | ICD-10-CM | POA: Diagnosis not present

## 2023-08-15 DIAGNOSIS — M4317 Spondylolisthesis, lumbosacral region: Secondary | ICD-10-CM | POA: Diagnosis not present

## 2023-08-15 DIAGNOSIS — M48061 Spinal stenosis, lumbar region without neurogenic claudication: Secondary | ICD-10-CM | POA: Diagnosis not present

## 2023-08-15 DIAGNOSIS — M5117 Intervertebral disc disorders with radiculopathy, lumbosacral region: Secondary | ICD-10-CM | POA: Diagnosis not present

## 2023-08-15 DIAGNOSIS — M4726 Other spondylosis with radiculopathy, lumbar region: Secondary | ICD-10-CM | POA: Diagnosis not present

## 2023-08-15 DIAGNOSIS — M5416 Radiculopathy, lumbar region: Secondary | ICD-10-CM | POA: Diagnosis not present

## 2023-09-03 DIAGNOSIS — M47816 Spondylosis without myelopathy or radiculopathy, lumbar region: Secondary | ICD-10-CM | POA: Diagnosis not present

## 2023-09-03 DIAGNOSIS — M5416 Radiculopathy, lumbar region: Secondary | ICD-10-CM | POA: Diagnosis not present

## 2023-09-03 DIAGNOSIS — M461 Sacroiliitis, not elsewhere classified: Secondary | ICD-10-CM | POA: Diagnosis not present

## 2023-09-17 DIAGNOSIS — M461 Sacroiliitis, not elsewhere classified: Secondary | ICD-10-CM | POA: Diagnosis not present

## 2023-09-24 DIAGNOSIS — M545 Low back pain, unspecified: Secondary | ICD-10-CM | POA: Diagnosis not present

## 2023-09-24 DIAGNOSIS — M51362 Other intervertebral disc degeneration, lumbar region with discogenic back pain and lower extremity pain: Secondary | ICD-10-CM | POA: Diagnosis not present

## 2023-09-26 DIAGNOSIS — M545 Low back pain, unspecified: Secondary | ICD-10-CM | POA: Diagnosis not present

## 2023-09-26 DIAGNOSIS — M51362 Other intervertebral disc degeneration, lumbar region with discogenic back pain and lower extremity pain: Secondary | ICD-10-CM | POA: Diagnosis not present

## 2023-10-01 DIAGNOSIS — M545 Low back pain, unspecified: Secondary | ICD-10-CM | POA: Diagnosis not present

## 2023-10-01 DIAGNOSIS — M51362 Other intervertebral disc degeneration, lumbar region with discogenic back pain and lower extremity pain: Secondary | ICD-10-CM | POA: Diagnosis not present

## 2023-10-03 DIAGNOSIS — M51362 Other intervertebral disc degeneration, lumbar region with discogenic back pain and lower extremity pain: Secondary | ICD-10-CM | POA: Diagnosis not present

## 2023-10-03 DIAGNOSIS — M545 Low back pain, unspecified: Secondary | ICD-10-CM | POA: Diagnosis not present

## 2023-10-08 DIAGNOSIS — M545 Low back pain, unspecified: Secondary | ICD-10-CM | POA: Diagnosis not present

## 2023-10-08 DIAGNOSIS — M51362 Other intervertebral disc degeneration, lumbar region with discogenic back pain and lower extremity pain: Secondary | ICD-10-CM | POA: Diagnosis not present

## 2023-10-10 DIAGNOSIS — M51362 Other intervertebral disc degeneration, lumbar region with discogenic back pain and lower extremity pain: Secondary | ICD-10-CM | POA: Diagnosis not present

## 2023-10-10 DIAGNOSIS — M545 Low back pain, unspecified: Secondary | ICD-10-CM | POA: Diagnosis not present

## 2023-10-11 DIAGNOSIS — M06049 Rheumatoid arthritis without rheumatoid factor, unspecified hand: Secondary | ICD-10-CM | POA: Diagnosis not present

## 2023-10-11 DIAGNOSIS — M79642 Pain in left hand: Secondary | ICD-10-CM | POA: Diagnosis not present

## 2023-10-11 DIAGNOSIS — D849 Immunodeficiency, unspecified: Secondary | ICD-10-CM | POA: Diagnosis not present

## 2023-10-11 DIAGNOSIS — M79641 Pain in right hand: Secondary | ICD-10-CM | POA: Diagnosis not present

## 2023-10-11 DIAGNOSIS — Z299 Encounter for prophylactic measures, unspecified: Secondary | ICD-10-CM | POA: Diagnosis not present

## 2023-10-12 DIAGNOSIS — M461 Sacroiliitis, not elsewhere classified: Secondary | ICD-10-CM | POA: Diagnosis not present

## 2023-10-12 DIAGNOSIS — M5416 Radiculopathy, lumbar region: Secondary | ICD-10-CM | POA: Diagnosis not present

## 2023-10-15 DIAGNOSIS — M545 Low back pain, unspecified: Secondary | ICD-10-CM | POA: Diagnosis not present

## 2023-10-15 DIAGNOSIS — M51362 Other intervertebral disc degeneration, lumbar region with discogenic back pain and lower extremity pain: Secondary | ICD-10-CM | POA: Diagnosis not present

## 2023-10-16 DIAGNOSIS — M06049 Rheumatoid arthritis without rheumatoid factor, unspecified hand: Secondary | ICD-10-CM | POA: Diagnosis not present

## 2023-10-24 DIAGNOSIS — M545 Low back pain, unspecified: Secondary | ICD-10-CM | POA: Diagnosis not present

## 2023-10-24 DIAGNOSIS — M51362 Other intervertebral disc degeneration, lumbar region with discogenic back pain and lower extremity pain: Secondary | ICD-10-CM | POA: Diagnosis not present

## 2023-10-29 DIAGNOSIS — M51362 Other intervertebral disc degeneration, lumbar region with discogenic back pain and lower extremity pain: Secondary | ICD-10-CM | POA: Diagnosis not present

## 2023-10-29 DIAGNOSIS — M545 Low back pain, unspecified: Secondary | ICD-10-CM | POA: Diagnosis not present

## 2023-10-31 DIAGNOSIS — M461 Sacroiliitis, not elsewhere classified: Secondary | ICD-10-CM | POA: Diagnosis not present

## 2023-11-28 DIAGNOSIS — Z299 Encounter for prophylactic measures, unspecified: Secondary | ICD-10-CM | POA: Diagnosis not present

## 2023-11-28 DIAGNOSIS — M06049 Rheumatoid arthritis without rheumatoid factor, unspecified hand: Secondary | ICD-10-CM | POA: Diagnosis not present

## 2023-11-28 DIAGNOSIS — D849 Immunodeficiency, unspecified: Secondary | ICD-10-CM | POA: Diagnosis not present

## 2023-11-28 DIAGNOSIS — M79641 Pain in right hand: Secondary | ICD-10-CM | POA: Diagnosis not present

## 2023-11-28 DIAGNOSIS — M79642 Pain in left hand: Secondary | ICD-10-CM | POA: Diagnosis not present

## 2023-12-25 DIAGNOSIS — M06049 Rheumatoid arthritis without rheumatoid factor, unspecified hand: Secondary | ICD-10-CM | POA: Diagnosis not present

## 2023-12-26 DIAGNOSIS — D849 Immunodeficiency, unspecified: Secondary | ICD-10-CM | POA: Diagnosis not present

## 2023-12-26 DIAGNOSIS — M79642 Pain in left hand: Secondary | ICD-10-CM | POA: Diagnosis not present

## 2023-12-26 DIAGNOSIS — Z299 Encounter for prophylactic measures, unspecified: Secondary | ICD-10-CM | POA: Diagnosis not present

## 2023-12-26 DIAGNOSIS — M06049 Rheumatoid arthritis without rheumatoid factor, unspecified hand: Secondary | ICD-10-CM | POA: Diagnosis not present

## 2023-12-26 DIAGNOSIS — M545 Low back pain, unspecified: Secondary | ICD-10-CM | POA: Diagnosis not present

## 2023-12-26 DIAGNOSIS — M79641 Pain in right hand: Secondary | ICD-10-CM | POA: Diagnosis not present

## 2023-12-28 DIAGNOSIS — M5416 Radiculopathy, lumbar region: Secondary | ICD-10-CM | POA: Diagnosis not present

## 2023-12-28 DIAGNOSIS — M461 Sacroiliitis, not elsewhere classified: Secondary | ICD-10-CM | POA: Diagnosis not present

## 2024-01-16 DIAGNOSIS — M461 Sacroiliitis, not elsewhere classified: Secondary | ICD-10-CM | POA: Diagnosis not present

## 2024-03-24 DIAGNOSIS — M48062 Spinal stenosis, lumbar region with neurogenic claudication: Secondary | ICD-10-CM | POA: Diagnosis not present

## 2024-03-25 DIAGNOSIS — M06049 Rheumatoid arthritis without rheumatoid factor, unspecified hand: Secondary | ICD-10-CM | POA: Diagnosis not present

## 2024-03-26 DIAGNOSIS — M48062 Spinal stenosis, lumbar region with neurogenic claudication: Secondary | ICD-10-CM | POA: Diagnosis not present

## 2024-03-27 DIAGNOSIS — M06049 Rheumatoid arthritis without rheumatoid factor, unspecified hand: Secondary | ICD-10-CM | POA: Diagnosis not present

## 2024-03-27 DIAGNOSIS — Z9989 Dependence on other enabling machines and devices: Secondary | ICD-10-CM | POA: Diagnosis not present

## 2024-03-27 DIAGNOSIS — D849 Immunodeficiency, unspecified: Secondary | ICD-10-CM | POA: Diagnosis not present

## 2024-03-27 DIAGNOSIS — Z299 Encounter for prophylactic measures, unspecified: Secondary | ICD-10-CM | POA: Diagnosis not present

## 2024-04-08 DIAGNOSIS — M47816 Spondylosis without myelopathy or radiculopathy, lumbar region: Secondary | ICD-10-CM | POA: Diagnosis not present

## 2024-04-29 DIAGNOSIS — Z79899 Other long term (current) drug therapy: Secondary | ICD-10-CM | POA: Diagnosis not present

## 2024-04-29 DIAGNOSIS — D849 Immunodeficiency, unspecified: Secondary | ICD-10-CM | POA: Diagnosis not present

## 2024-04-29 DIAGNOSIS — M06049 Rheumatoid arthritis without rheumatoid factor, unspecified hand: Secondary | ICD-10-CM | POA: Diagnosis not present

## 2024-04-29 DIAGNOSIS — Z299 Encounter for prophylactic measures, unspecified: Secondary | ICD-10-CM | POA: Diagnosis not present

## 2024-05-15 DIAGNOSIS — M545 Low back pain, unspecified: Secondary | ICD-10-CM | POA: Diagnosis not present

## 2024-05-15 DIAGNOSIS — Z0000 Encounter for general adult medical examination without abnormal findings: Secondary | ICD-10-CM | POA: Diagnosis not present

## 2024-05-15 DIAGNOSIS — M06049 Rheumatoid arthritis without rheumatoid factor, unspecified hand: Secondary | ICD-10-CM | POA: Diagnosis not present

## 2024-05-15 DIAGNOSIS — Z1389 Encounter for screening for other disorder: Secondary | ICD-10-CM | POA: Diagnosis not present

## 2024-05-15 DIAGNOSIS — Z7189 Other specified counseling: Secondary | ICD-10-CM | POA: Diagnosis not present

## 2024-05-15 DIAGNOSIS — Z299 Encounter for prophylactic measures, unspecified: Secondary | ICD-10-CM | POA: Diagnosis not present

## 2024-05-22 DIAGNOSIS — Z1212 Encounter for screening for malignant neoplasm of rectum: Secondary | ICD-10-CM | POA: Diagnosis not present

## 2024-05-22 DIAGNOSIS — Z1211 Encounter for screening for malignant neoplasm of colon: Secondary | ICD-10-CM | POA: Diagnosis not present

## 2024-06-24 DIAGNOSIS — M48062 Spinal stenosis, lumbar region with neurogenic claudication: Secondary | ICD-10-CM | POA: Diagnosis not present

## 2024-06-24 DIAGNOSIS — M461 Sacroiliitis, not elsewhere classified: Secondary | ICD-10-CM | POA: Diagnosis not present

## 2024-06-30 DIAGNOSIS — Z299 Encounter for prophylactic measures, unspecified: Secondary | ICD-10-CM | POA: Diagnosis not present

## 2024-06-30 DIAGNOSIS — M06049 Rheumatoid arthritis without rheumatoid factor, unspecified hand: Secondary | ICD-10-CM | POA: Diagnosis not present

## 2024-06-30 DIAGNOSIS — M1990 Unspecified osteoarthritis, unspecified site: Secondary | ICD-10-CM | POA: Diagnosis not present

## 2024-07-07 DIAGNOSIS — M461 Sacroiliitis, not elsewhere classified: Secondary | ICD-10-CM | POA: Diagnosis not present

## 2024-07-16 DIAGNOSIS — M0600 Rheumatoid arthritis without rheumatoid factor, unspecified site: Secondary | ICD-10-CM | POA: Diagnosis not present

## 2024-07-16 DIAGNOSIS — R5383 Other fatigue: Secondary | ICD-10-CM | POA: Diagnosis not present

## 2024-07-16 DIAGNOSIS — M79642 Pain in left hand: Secondary | ICD-10-CM | POA: Diagnosis not present

## 2024-07-16 DIAGNOSIS — M79641 Pain in right hand: Secondary | ICD-10-CM | POA: Diagnosis not present

## 2024-08-13 DIAGNOSIS — M0600 Rheumatoid arthritis without rheumatoid factor, unspecified site: Secondary | ICD-10-CM | POA: Diagnosis not present

## 2024-08-13 DIAGNOSIS — M79641 Pain in right hand: Secondary | ICD-10-CM | POA: Diagnosis not present

## 2024-08-13 DIAGNOSIS — M79642 Pain in left hand: Secondary | ICD-10-CM | POA: Diagnosis not present

## 2024-08-13 DIAGNOSIS — M1991 Primary osteoarthritis, unspecified site: Secondary | ICD-10-CM | POA: Diagnosis not present

## 2024-08-27 DIAGNOSIS — Z299 Encounter for prophylactic measures, unspecified: Secondary | ICD-10-CM | POA: Diagnosis not present

## 2024-08-27 DIAGNOSIS — J069 Acute upper respiratory infection, unspecified: Secondary | ICD-10-CM | POA: Diagnosis not present

## 2024-08-27 DIAGNOSIS — R0981 Nasal congestion: Secondary | ICD-10-CM | POA: Diagnosis not present

## 2024-09-08 DIAGNOSIS — M47816 Spondylosis without myelopathy or radiculopathy, lumbar region: Secondary | ICD-10-CM | POA: Diagnosis not present

## 2024-09-08 DIAGNOSIS — M461 Sacroiliitis, not elsewhere classified: Secondary | ICD-10-CM | POA: Diagnosis not present

## 2024-09-15 DIAGNOSIS — M461 Sacroiliitis, not elsewhere classified: Secondary | ICD-10-CM | POA: Diagnosis not present

## 2024-09-15 DIAGNOSIS — M961 Postlaminectomy syndrome, not elsewhere classified: Secondary | ICD-10-CM | POA: Diagnosis not present

## 2024-09-16 ENCOUNTER — Other Ambulatory Visit: Payer: Self-pay | Admitting: Neurosurgery

## 2024-09-16 DIAGNOSIS — M461 Sacroiliitis, not elsewhere classified: Secondary | ICD-10-CM

## 2024-10-01 ENCOUNTER — Ambulatory Visit
Admission: RE | Admit: 2024-10-01 | Discharge: 2024-10-01 | Disposition: A | Discharge status: Home / Self Care | Source: Ambulatory Visit | Attending: Neurosurgery | Admitting: Neurosurgery

## 2024-10-01 ENCOUNTER — Inpatient Hospital Stay
Admission: RE | Admit: 2024-10-01 | Discharge: 2024-10-01 | Disposition: A | Source: Ambulatory Visit | Attending: Neurosurgery

## 2024-10-01 DIAGNOSIS — M461 Sacroiliitis, not elsewhere classified: Secondary | ICD-10-CM

## 2024-11-25 ENCOUNTER — Other Ambulatory Visit: Payer: Self-pay | Admitting: Neurosurgery

## 2024-12-17 ENCOUNTER — Other Ambulatory Visit: Payer: Self-pay

## 2024-12-24 NOTE — Progress Notes (Signed)
 Surgical Instructions   Your procedure is scheduled on Wednesday February 11, [address]. Report to [facility] Main Entrance A at 6:30 A.M., then check in with the Admitting office. Any questions or running late day of surgery: call [phone]  Questions prior to your surgery date: call [phone], Monday-Friday, 8am-4pm. If you experience any cold or flu symptoms such as cough, fever, chills, shortness of breath, etc. between now and your scheduled surgery, please notify us  at the above number.     Remember:  Do not eat or drink after midnight the night before your surgery  Take these medicines the morning of surgery with A SIP OF WATER   acetaminophen  (TYLENOL )  tamsulosin (FLOMAX)  Triamcinolone  Acetonide (NASACORT  ALLERGY 24HR NA)   May take these medicines IF NEEDED: oxyCODONE  (OXY IR/ROXICODONE )    One week prior to surgery, STOP taking any Aspirin  (unless otherwise instructed by your surgeon) Aleve, Naproxen, Ibuprofen, Motrin, Advil, Goody's, BC's, all herbal medications, fish oil, and non-prescription vitamins.  This includes your diclofenac (VOLTAREN) and your Omega-3 Fatty Acids (FISH OIL).                       Do NOT Smoke (Tobacco/Vaping) for 24 hours prior to your procedure.  If you use a CPAP at night, you may bring your mask/headgear for your overnight stay.   You will be asked to remove any contacts, glasses, piercing's, hearing aid's, dentures/partials prior to surgery. Please bring cases for these items if needed.    Your surgeon will determine if you are to be admitted or discharged the same day.  Patients discharged the day of surgery will not be allowed to drive home, and someone needs to stay with them for 24 hours.  SURGICAL WAITING ROOM VISITATION Patients may have no more than 2 support people in the waiting area - these visitors may rotate.   Pre-op nurse will coordinate an appropriate time for 2 ADULT support persons, who may not rotate, to accompany  patient in pre-op.  Children under the age of 31 must have an adult with them who is not the patient and must remain in the main waiting area with an adult.  If the patient needs to stay at the hospital during part of their recovery, the visitor guidelines for inpatient rooms apply.  Please refer to the [facility] website for the visitor guidelines for any additional information.   If you received a COVID test during your pre-op visit  it is requested that you wear a mask when out in public, stay away from anyone that may not be feeling well and notify your surgeon if you develop symptoms. If you have been in contact with anyone that has tested positive in the last 10 days please notify you surgeon.      Pre-operative 4 CHG Bathing Instructions   You can play a key role in reducing the risk of infection after surgery. Your skin needs to be as free of germs as possible. You can reduce the number of germs on your skin by washing with CHG (chlorhexidine  gluconate) soap before surgery. CHG is an antiseptic soap that kills germs and continues to kill germs even after washing.   DO NOT use if you have an allergy to chlorhexidine /CHG or antibacterial soaps. If your skin becomes reddened or irritated, stop using the CHG and notify one of our RNs at [phone].   Please shower with the CHG soap starting 4 days before surgery using the following  schedule:     Please keep in mind the following:   You may shave your face at any point before/day of surgery.  Place clean sheets on your bed the day you start using CHG soap. Use a clean washcloth (not used since being washed) for each shower. DO NOT sleep with pets once you start using the CHG.   CHG Shower Instructions:  Wash your face and private area with normal soap. If you choose to wash your hair, wash first with your normal shampoo.  After you use shampoo/soap, rinse your hair and body thoroughly to remove shampoo/soap residue.  Turn the  water  OFF and apply  bottle of CHG soap to a CLEAN washcloth.  Apply CHG soap ONLY FROM YOUR NECK DOWN TO YOUR TOES (washing for 3-5 minutes)  DO NOT use CHG soap on face, private areas, open wounds, or sores.  Pay special attention to the area where your surgery is being performed.  If you are having back surgery, having someone wash your back for you may be helpful. Wait 2 minutes after CHG soap is applied, then you may rinse off the CHG soap.  Pat dry with a clean towel  Put on clean clothes/pajamas   If you choose to wear lotion, please use ONLY the CHG-compatible lotions that are listed below.  Additional instructions for the day of surgery:  If you choose, you may shower the morning of surgery with an antibacterial soap.  DO NOT APPLY any lotions, deodorants or cologne.   Do not bring valuables to the hospital. [facility] is not responsible for any belongings/valuables. Do not wear jewelry Put on clean/comfortable clothes.  Please brush your teeth.  Ask your nurse before applying any prescription medications to the skin.     CHG Compatible Lotions   Aveeno Moisturizing lotion  Cetaphil Moisturizing Cream  Cetaphil Moisturizing Lotion  Clairol Herbal Essence Moisturizing Lotion, Dry Skin  Clairol Herbal Essence Moisturizing Lotion, Extra Dry Skin  Clairol Herbal Essence Moisturizing Lotion, Normal Skin  Curel Age Defying Therapeutic Moisturizing Lotion with Alpha Hydroxy  Curel Extreme Care Body Lotion  Curel Soothing Hands Moisturizing Hand Lotion  Curel Therapeutic Moisturizing Cream, Fragrance-Free  Curel Therapeutic Moisturizing Lotion, Fragrance-Free  Curel Therapeutic Moisturizing Lotion, Original Formula  Eucerin Daily Replenishing Lotion  Eucerin Dry Skin Therapy Plus Alpha Hydroxy Crme  Eucerin Dry Skin Therapy Plus Alpha Hydroxy Lotion  Eucerin Original Crme  Eucerin Original Lotion  Eucerin Plus Crme Eucerin Plus Lotion  Eucerin TriLipid Replenishing  Lotion  Keri Anti-Bacterial Hand Lotion  Keri Deep Conditioning Original Lotion Dry Skin Formula Softly Scented  Keri Deep Conditioning Original Lotion, Fragrance Free Sensitive Skin Formula  Keri Lotion Fast Absorbing Fragrance Free Sensitive Skin Formula  Keri Lotion Fast Absorbing Softly Scented Dry Skin Formula  Keri Original Lotion  Keri Skin Renewal Lotion Keri Silky Smooth Lotion  Keri Silky Smooth Sensitive Skin Lotion  Nivea Body Creamy Conditioning Oil  Nivea Body Extra Enriched Lotion  Nivea Body Original Lotion  Nivea Body Sheer Moisturizing Lotion Nivea Crme  Nivea Skin Firming Lotion  NutraDerm 30 Skin Lotion  NutraDerm Skin Lotion  NutraDerm Therapeutic Skin Cream  NutraDerm Therapeutic Skin Lotion  ProShield Protective Hand Cream  Provon moisturizing lotion  Please read over the following fact sheets that you were given.

## 2024-12-25 ENCOUNTER — Encounter (HOSPITAL_COMMUNITY): Payer: Self-pay

## 2024-12-25 ENCOUNTER — Inpatient Hospital Stay (HOSPITAL_COMMUNITY): Admission: RE | Admit: 2024-12-25 | Discharge: 2024-12-25 | Attending: Neurosurgery

## 2024-12-25 ENCOUNTER — Other Ambulatory Visit: Payer: Self-pay

## 2024-12-25 VITALS — BP 133/80 | HR 89 | Temp 98.5°F | Resp 18 | Ht 67.5 in | Wt 177.8 lb

## 2024-12-25 DIAGNOSIS — Z01818 Encounter for other preprocedural examination: Secondary | ICD-10-CM

## 2024-12-25 LAB — CBC
HCT: 42.6 % (ref 39.0–52.0)
Hemoglobin: 14.8 g/dL (ref 13.0–17.0)
MCH: 32.1 pg (ref 26.0–34.0)
MCHC: 34.7 g/dL (ref 30.0–36.0)
MCV: 92.4 fL (ref 80.0–100.0)
Platelets: 282 10*3/uL (ref 150–400)
RBC: 4.61 MIL/uL (ref 4.22–5.81)
RDW: 12 % (ref 11.5–15.5)
WBC: 6.8 10*3/uL (ref 4.0–10.5)
nRBC: 0 % (ref 0.0–0.2)

## 2024-12-25 LAB — TYPE AND SCREEN
ABO/RH(D): A POS
Antibody Screen: NEGATIVE

## 2024-12-25 LAB — BASIC METABOLIC PANEL WITH GFR
Anion gap: 11 (ref 5–15)
BUN: 14 mg/dL (ref 8–23)
CO2: 26 mmol/L (ref 22–32)
Calcium: 9.1 mg/dL (ref 8.9–10.3)
Chloride: 102 mmol/L (ref 98–111)
Creatinine, Ser: 0.68 mg/dL (ref 0.61–1.24)
GFR, Estimated: >60 mL/min
Glucose, Bld: 85 mg/dL (ref 70–99)
Potassium: 4.2 mmol/L (ref 3.5–5.1)
Sodium: 139 mmol/L (ref 135–145)

## 2024-12-25 LAB — SURGICAL PCR SCREEN
MRSA, PCR: POSITIVE — AB
Staphylococcus aureus: POSITIVE — AB

## 2024-12-25 NOTE — Progress Notes (Signed)
 PCP - Dr. Leta Fear Cardiologist - Denies  PPM/ICD - Denies Device Orders - n/a Rep Notified - n/a  Chest x-ray - n/a EKG - Denies Stress Test - Denies ECHO - Denies Cardiac Cath - Denies  Sleep Study - Denies CPAP - n/a  No DM  Last dose of GLP1 agonist- n/a GLP1 instructions: n/a  Blood Thinner Instructions: n/a Aspirin  Instructions: n/a  NPO after midnight  COVID TEST- n/a   Anesthesia review: No.   Patient denies shortness of breath, fever, cough and chest pain at PAT appointment. Pt denies any respiratory illness/infection in the last two months.   All instructions explained to the patient, with a verbal understanding of the material. Patient agrees to go over the instructions while at home for a better understanding. Patient also instructed to self quarantine after being tested for COVID-19. The opportunity to ask questions was provided.

## 2024-12-26 NOTE — Progress Notes (Signed)
 Voicemail and callback number left with Levon Bracket, OR scheduler for Dr. Debby regarding Surgical PCR result +MRSA and +MSSA

## 2024-12-30 ENCOUNTER — Encounter: Admitting: Vascular Surgery

## 2024-12-31 ENCOUNTER — Ambulatory Visit (HOSPITAL_COMMUNITY): Admission: RE | Admit: 2024-12-31 | Source: Home / Self Care

## 2024-12-31 ENCOUNTER — Encounter (HOSPITAL_COMMUNITY): Admission: RE | Payer: Self-pay | Source: Home / Self Care
